# Patient Record
Sex: Female | Born: 1985 | Race: Black or African American | Hispanic: No | Marital: Married | Smoking: Current every day smoker
Health system: Southern US, Community
[De-identification: ages and names within clinical notes are randomized; demographics above are authoritative.]

## PROBLEM LIST (undated history)

## (undated) ENCOUNTER — Inpatient Hospital Stay (HOSPITAL_COMMUNITY): Payer: Self-pay

## (undated) DIAGNOSIS — B977 Papillomavirus as the cause of diseases classified elsewhere: Secondary | ICD-10-CM

## (undated) HISTORY — PX: WISDOM TOOTH EXTRACTION: SHX21

---
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
## 2007-03-28 ENCOUNTER — Emergency Department (HOSPITAL_COMMUNITY): Admission: EM | Admit: 2007-03-28 | Discharge: 2007-03-29 | Payer: Self-pay | Admitting: Emergency Medicine

## 2010-11-03 ENCOUNTER — Encounter: Payer: Self-pay | Admitting: Family Medicine

## 2011-02-28 NOTE — H&P (Signed)
[name], [name]                ACCOUNT NO.:  [id number]   MEDICAL RECORD NO.:  [id number]          PATIENT TYPE:  INP   LOCATION:  3036                         FACILITY:  [facility]   PHYSICIAN:  [name], M.D.DATE OF BIRTH:  Apr 13, 1986   DATE OF ADMISSION:  03/28/2006  DATE OF DISCHARGE:                                HISTORY & PHYSICAL   CHIEF COMPLAINT:  Abdominal pain.   HISTORY OF PRESENT ILLNESS:  This is a 25 year old G1, P0-1-0-1, who  presented to Urgent Care with abdominal pain, vomiting, chills, and sweats  for approximately 1 week. The patient states that the pain began as cramping  and progressed to a constant throbbing pain approximately 4 days ago,  located in the left lower quadrant and peri-umbilically. Also patient has  nausea and vomiting and the inability to keep food down. She tried multiple  medications per her parents including Ibuprofen 800 mg tablet, Nexium,  hydrocodone, and a prescription nausea medication that she was unaware of  the name. These medications may have provided some relief, however, the pain  always came back. She states that she had had sweats and chills throughout  the night, unable to sleep. She is sexually active with 1 female for the last  4 years. No condom usage. She was treated for gonorrhea approximately 6  months ago. However, the patient states that she only had a IM injection.  She did not get her prescription pills filled, so she never took the  complete treatment. The patient's partner has been treated since that time.   ALLERGIES:  NO KNOWN DRUG ALLERGIES.   CURRENT MEDICATIONS:  None except for what was stated above, right now.   PAST MEDICAL HISTORY:  1.  Significant for vaginal delivery in 2006.  2.  Migraines.   SOCIAL HISTORY:  The patient lives with her parents. She is in a current  relationship with the father of her baby for the past 4 years. She is  planning on attending [employer]. She  has graduated from  high school. Her last intercourse was approximately 3 weeks ago. Prior use,  history of marijuana usage in the past. None currently. No other drug usage.  No tobacco usage.   FAMILY HISTORY:  Significant for diabetes in a number of relatives, aunts,  and grandparents.   PHYSICAL EXAMINATION:  VITAL SIGNS:  Temperature 97.9, blood pressure  118/67, pulse 82, respiratory rate 18. O2 saturation 97% on room air.  GENERAL:  She is alert. No apparent distress.  HEENT:  Pupils are equal, round, and reactive to light, pinpoint. No  thyromegaly. No adenopathy. Oropharynx is clear.  CARDIOVASCULAR:  Regular rate and rhythm. No murmur, rub, or gallop.  LUNGS:  Clear to auscultation bilaterally with good respiratory effort and  air movement.  ABDOMEN:  Positive bowel sounds. Soft and tender to palpation in the left  lower quadrant. No rebound or guarding, however. No palpable masses.  PELVIC:  Thin yellow discharge noted. Difficulty doing examination and the  patient's pain level with examination. Ring forceps were used to remove her  IUD and GC Chlamydia probe was obtained and that was the extent of the exam.   LABORATORY DATA:  In Urgent Care, patient had a wet prep done, which was  negative. A urinalysis that showed 100 glucose, moderate bilirubin, 80  ketones, 100 protein, trace leukocyte esterase. A Meikle blood cell count of  17.3. Hemoglobin of 14.7. Platelet count of 271,000 with an absolute  neutrophil count of 13.9.  Labs pending here are C-met, CBC with  differential, GC and Chlamydia probe, HIV, RPR, PT, PTT.   Pelvic ultrasound was obtained at [facility], which  showed a 7 x 5 cm left adnexal TOA with a small amount of free fluid and IUD  in the uterus.   ASSESSMENT/PLAN:  1.  A 25 year old African-American female with tubal ovarian abscess/pelvic      inflammatory disease. Curb sided gynecology. Will begin Unasyn IV and      Doxycycline p.o.  concurrently. Will continue IV antibiotics at least 48      hours or if the patient is febrile, continue until she has been afebrile      for 48 hours. If she has not had any improvement, will consider      Interventional Radiology for isolation of the abscess, however, do not      think that it is needed currently and actually could be detrimental if      done while she has so much inflammation, which could lead to a      peritonitis.  2.  Highly sexual behavior. IUD removed. Will screen for STD. Discussed safe      sex practices and risks that she is taking.  3.  FEN. The patient is currently tolerating clear liquids but she did have      an IM injection of Phenergan at [facility]. Will begin      maintenance IV fluids and it appears that she does not appear severely      dehydrated now and advance her diet as tolerated. Will continue      Phenergan p.r.n.      Barbara Quin, MD    ______________________________  Barbara Bumpers Leveda Anna, [name]    AD/MEDQ  D:  03/28/2006  T:  03/28/2006  Job:  [id number]   cc:   Dr. [facility]

## 2011-02-28 NOTE — Discharge Summary (Signed)
NAMEPAYSON, CRUMBY NO.:  [id number]   MEDICAL RECORD NO.:  [id number]          PATIENT TYPE:  OUT   LOCATION:  XRAY                         FACILITY:  [facility]   PHYSICIAN:  Alanson Puls, [name]    DATE OF BIRTH:  1986-08-03   DATE OF ADMISSION:  03/28/2006  DATE OF DISCHARGE:  03/28/2006                                 DISCHARGE SUMMARY   ADDENDUM TO PREVIOUS DICTATION #[id number]   DISCHARGE MEDICATIONS:  Were changed to levofloxacin 500 mg p.o. daily for a  total of 14 days until April 10, 2006 and metronidazole 500 mg p.o. b.i.d.  for 14 days until April 10, 2006 per [facility] outpatient recommendations.      Alanson Puls, [name]     MR/MEDQ  D:  04/03/2006  T:  04/03/2006  Job:  [id number]

## 2011-02-28 NOTE — Discharge Summary (Signed)
[name], [name]                ACCOUNT NO.:  [id number]   MEDICAL RECORD NO.:  [id number]          PATIENT TYPE:  INP   LOCATION:  9307                          FACILITY:  [facility]   PHYSICIAN:  James A. Ashley Royalty, M.D.DATE OF BIRTH:  [birth year]   DATE OF ADMISSION:  05/06/2005  DATE OF DISCHARGE:  05/08/2005                                 DISCHARGE SUMMARY   ADMITTING DIAGNOSES:  1.  Intrauterine pregnancy at 31 weeks 5 days.  2.  Preterm labor.  3.  Unknown group B streptococcus status.   DISCHARGE DIAGNOSES:  1.  Intrauterine pregnancy at 31 weeks 5 days.  2.  Preterm labor.  3.  Unknown group B streptococcus status.   PROCEDURES:  1.  Normal spontaneous vaginal delivery of viable female infant.  2.  Division of nuchal cord.   HISTORY OF THE PRESENT ILLNESS:  The patient is a 25 year old primigravida  with an EDC of July 03, 2005. Prenatal course was complicated by  bacterial vaginosis. She also has a history of heavy periods and strong  family history of diabetes.   LABORATORY:  Blood type A positive, antibody screen negative. RPR, HbsAg,  HIV nonreactive.   HOSPITAL COURSE AND TREATMENT:  The patient was admitted on May 06, 2005  with onset of uterine contractions which had been occurring for several  hours. She was immediately sent to maternity admissions where she was noted  to be almost completely dilated. Artificial rupture of membranes revealed  clear fluid. She was prophylactically treated with antibiotics for group B  strep and proceeded to deliver a female infant weighing 3 pounds 8 ounces  over a midline episiotomy. Placenta was sent to pathology.   POSTOPERATIVE COURSE:  The patient remained afebrile, was able to be  discharged in satisfactory condition on May 08, 2005.   DISCHARGE INSTRUCTIONS:  Follow up in the office in 6 weeks. Motrin 600 mg  q.i.d. Hemoglobin 10.5.     Elwyn Lade . Hancock, N.P.    ______________________________  Rudy Jew  Ashley Royalty, [name]   MKH/MEDQ  D:  06/04/2005  T:  06/04/2005  Job:  [id number]

## 2011-02-28 NOTE — Discharge Summary (Signed)
NAMEBRAYLI, KLINGBEIL                ACCOUNT NO.:  [id number]   MEDICAL RECORD NO.:  [id number]          PATIENT TYPE:  INP   LOCATION:  3036                         FACILITY:  [facility]   PHYSICIAN:  Alanson Puls, [name]    DATE OF BIRTH:  December 26, 1985   DATE OF ADMISSION:  03/28/2006  DATE OF DISCHARGE:                                 DISCHARGE SUMMARY   DICTATED BY:  Glorianne Manchester, [name]   ATTENDING PHYSICIAN AT ADMISSION:  Dr. Doralee Albino.   ATTENDING PHYSICIAN AT DISCHARGE:  Dr. Tawanna Cooler McDiarmid.   ADMISSION DIAGNOSES:  1.  Left tubo-ovarian abscess measuring approximately 5.3 x 7.2 x 7.3 cm.  2.  History of gonorrhea treated 3 months prior to admission.   DISCHARGE DIAGNOSES:  1.  Left tubo-ovarian abscess measuring approximately 5.3 x 7.2 x 7.3 cm.  2.  History of gonorrhea treated 3 months prior to admission/high-risk      sexual behavior.  3.  On status post intrauterine device removal on admission on March 28, 2006.   HOSPITAL COURSE:  [name] is a 25 year old female who presented [facility] with persistent lower abdominal pain, vomiting, fevers and  chills for 1 week's duration and patient was found to have a left tubo-  ovarian abscess on transvaginal ultrasound.  Urine pregnancy test was  negative. Wet prep was obtained which was negative per the outside facility.  GC and Chlamydia cultures were obtained.  Prior to admission here, the  patient was found to be positive for gonorrhea and was treated with  ceftriaxone 1 g IV x1.  Chlamydia cultures were negative.  Given high sexual  behavior, she was screened for other STDs including HIV and syphilis which  were nonreactive here.  Her IUD was removed upon admission here and she  completed a total of 5 days Unasyn 3 g IV q.6 as well as doxycycline 100 mg  p.o. b.i.d.  Patient did undergo a CT of the abdomen on April 01, 2006 which  revealed a 7 cm complex lesion in her left pelvis that is representative  of  a tubo-ovarian abscess, although the lesion could be assessed  percutaneously, the fact that it has small loculations make the cyst not a  good candidate for percutaneous drainage.  Therefore, she did not have  drainage of this abscess.  Prior to discharge, patient was afebrile.  Blood  cultures were negative to date.  Dorsch blood cell count 9.7 with 63% segs.   DISCHARGE MEDICATIONS:  1.  Cefuroxime 500 mg p.o. b.i.d. for a total of 14 days to stop on April 10, 2006.  2.  Doxycycline 100 mg p.o. b.i.d. for a total of 14 days to stop on April 10, 2006.   DISCHARGE INSTRUCTIONS:  Patient was told that she could take ibuprofen as  well as Tylenol per bottle instructions over-the-counter for pain.  She was  counseled on contraceptive use and to choose a different alternative for  contraception since her IUD has been removed.  She is to follow up with Dr.  Reed Breech at [facility] and given the number [phone].  Call to  schedule this appointment within 1-2 weeks and to have a repeat ultrasound  on  May 14, 2006, 6-8 weeks after the diagnosis to followup on the resolution  of the tubo-ovarian abscess.  These results should be sent to [facility] to Dr. Reed Breech.  She is instructed to return to the emergency department  or clinic for fever, abdominal pain, inability to take food or drink by  mouth.      Alanson Puls, [name]     MR/MEDQ  D:  04/03/2006  T:  04/03/2006  Job:  [id number]   cc:   Onalee Hua L. Reed Breech, [name]  Fax: [phone]

## 2011-07-30 LAB — I-STAT 8, (EC8 V) (CONVERTED LAB)
Acid-Base Excess: 2
BUN: 9
Bicarbonate: 27.4 — ABNORMAL HIGH
Glucose, Bld: 90
HCT: 46
Hemoglobin: 15.6
Operator id: 277751
Potassium: 3.9
Sodium: 140
pH, Ven: 7.379 — ABNORMAL HIGH

## 2011-07-30 LAB — GC/CHLAMYDIA PROBE AMP, GENITAL: GC Probe Amp, Genital: POSITIVE — AB

## 2011-07-30 LAB — URINE MICROSCOPIC-ADD ON

## 2011-07-30 LAB — RPR: RPR Ser Ql: NONREACTIVE

## 2011-07-30 LAB — CBC
MCHC: 33.3
Platelets: 171
WBC: 9.6

## 2011-07-30 LAB — URINALYSIS, ROUTINE W REFLEX MICROSCOPIC
Nitrite: NEGATIVE
Specific Gravity, Urine: 1.03
Urobilinogen, UA: 2 — ABNORMAL HIGH

## 2011-07-30 LAB — POCT I-STAT CREATININE: Operator id: 277751

## 2011-07-30 LAB — DIFFERENTIAL
Basophils Relative: 0
Eosinophils Relative: 1
Monocytes Relative: 10

## 2013-03-02 ENCOUNTER — Emergency Department (HOSPITAL_COMMUNITY)
Admission: EM | Admit: 2013-03-02 | Discharge: 2013-03-02 | Disposition: A | Discharge status: Home / Self Care | Payer: Self-pay | Attending: Emergency Medicine | Admitting: Emergency Medicine

## 2013-03-02 ENCOUNTER — Encounter (HOSPITAL_COMMUNITY): Payer: Self-pay | Admitting: *Deleted

## 2013-03-02 DIAGNOSIS — K920 Hematemesis: Secondary | ICD-10-CM | POA: Insufficient documentation

## 2013-03-02 DIAGNOSIS — Z3202 Encounter for pregnancy test, result negative: Secondary | ICD-10-CM | POA: Insufficient documentation

## 2013-03-02 DIAGNOSIS — R630 Anorexia: Secondary | ICD-10-CM | POA: Insufficient documentation

## 2013-03-02 DIAGNOSIS — F17200 Nicotine dependence, unspecified, uncomplicated: Secondary | ICD-10-CM | POA: Insufficient documentation

## 2013-03-02 DIAGNOSIS — N390 Urinary tract infection, site not specified: Secondary | ICD-10-CM | POA: Insufficient documentation

## 2013-03-02 DIAGNOSIS — E860 Dehydration: Secondary | ICD-10-CM | POA: Insufficient documentation

## 2013-03-02 DIAGNOSIS — R1013 Epigastric pain: Secondary | ICD-10-CM | POA: Insufficient documentation

## 2013-03-02 DIAGNOSIS — R112 Nausea with vomiting, unspecified: Secondary | ICD-10-CM | POA: Insufficient documentation

## 2013-03-02 DIAGNOSIS — M6281 Muscle weakness (generalized): Secondary | ICD-10-CM | POA: Insufficient documentation

## 2013-03-02 DIAGNOSIS — R109 Unspecified abdominal pain: Secondary | ICD-10-CM

## 2013-03-02 LAB — URINALYSIS, ROUTINE W REFLEX MICROSCOPIC
Bilirubin Urine: NEGATIVE
Glucose, UA: NEGATIVE mg/dL
Ketones, ur: 15 mg/dL — AB
Urobilinogen, UA: 1 mg/dL (ref 0.0–1.0)

## 2013-03-02 LAB — CBC WITH DIFFERENTIAL/PLATELET
Basophils Relative: 0 % (ref 0–1)
Hemoglobin: 16.2 g/dL — ABNORMAL HIGH (ref 12.0–15.0)
Lymphocytes Relative: 21 % (ref 12–46)
Lymphs Abs: 1.5 10*3/uL (ref 0.7–4.0)
Neutro Abs: 5.4 10*3/uL (ref 1.7–7.7)
Neutrophils Relative %: 72 % (ref 43–77)
Platelets: 232 10*3/uL (ref 150–400)
RDW: 13.7 % (ref 11.5–15.5)

## 2013-03-02 LAB — COMPREHENSIVE METABOLIC PANEL
ALT: 13 U/L (ref 0–35)
Alkaline Phosphatase: 63 U/L (ref 39–117)
CO2: 23 mEq/L (ref 19–32)
Calcium: 9.6 mg/dL (ref 8.4–10.5)
Chloride: 102 mEq/L (ref 96–112)
GFR calc Af Amer: >90 mL/min (ref >90)
Glucose, Bld: 115 mg/dL — ABNORMAL HIGH (ref 70–99)
Potassium: 4.2 mEq/L (ref 3.5–5.1)
Total Bilirubin: 0.2 mg/dL — ABNORMAL LOW (ref 0.3–1.2)
Total Protein: 8.1 g/dL (ref 6.0–8.3)

## 2013-03-02 LAB — URINE MICROSCOPIC-ADD ON

## 2013-03-02 MED ORDER — ONDANSETRON HCL 4 MG/2ML IJ SOLN
4.0000 mg | Freq: Once | INTRAMUSCULAR | Status: AC
  Administered 2013-03-02: 4 mg via INTRAVENOUS
  Filled 2013-03-02: qty 2

## 2013-03-02 MED ORDER — HYDROCODONE-ACETAMINOPHEN 5-325 MG PO TABS: 1.0000 | ORAL_TABLET | Freq: Four times a day (QID) | ORAL | Status: DC | PRN

## 2013-03-02 MED ORDER — GI COCKTAIL ~~LOC~~
30.0000 mL | Freq: Once | ORAL | Status: AC
  Administered 2013-03-02: 30 mL via ORAL
  Filled 2013-03-02: qty 30

## 2013-03-02 MED ORDER — CIPROFLOXACIN HCL 500 MG PO TABS: 500.0000 mg | ORAL_TABLET | Freq: Two times a day (BID) | ORAL | Status: DC

## 2013-03-02 MED ORDER — DEXTROSE 5 % IV SOLN
1.0000 g | Freq: Once | INTRAVENOUS | Status: AC
  Administered 2013-03-02: 1 g via INTRAVENOUS
  Filled 2013-03-02: qty 10

## 2013-03-02 MED ORDER — ONDANSETRON HCL 4 MG PO TABS: 4.0000 mg | ORAL_TABLET | Freq: Four times a day (QID) | ORAL | Status: DC

## 2013-03-02 MED ORDER — SODIUM CHLORIDE 0.9 % IV SOLN
Freq: Once | INTRAVENOUS | Status: AC
  Administered 2013-03-02: 21:00:00 via INTRAVENOUS

## 2013-03-02 MED ORDER — ESOMEPRAZOLE MAGNESIUM 40 MG PO CPDR: 40.0000 mg | DELAYED_RELEASE_CAPSULE | Freq: Every day | ORAL | Status: DC

## 2013-03-02 MED ORDER — MORPHINE SULFATE 4 MG/ML IJ SOLN
8.0000 mg | Freq: Once | INTRAMUSCULAR | Status: AC
  Administered 2013-03-02: 8 mg via INTRAVENOUS
  Filled 2013-03-02: qty 2

## 2013-03-02 NOTE — ED Notes (Signed)
Pt is here with mid upper abdominal and epigastric pain that started this am.  Pt reports that she has been vomiting all morning.  No diarrhea or constipation

## 2013-03-02 NOTE — ED Provider Notes (Signed)
History     CSN: [id number]  Arrival date & time 03/02/13  1338   First MD Initiated Contact with Patient 03/02/13 1910      Chief Complaint  Patient presents with  . Abdominal Pain    (Consider location/radiation/quality/duration/timing/severity/associated sxs/prior treatment) HPI  [name] is a 27 y.o. female with complaint of gastrointestinal symptoms of abdominal pain, nausea, vomiting, anorexia, blood in emesis, dehydration, weakness for 1 days. No blood in stool. Patient awoke from sleep at approximately 3 AM this morning with epigastric pain nausea and vomiting.  She states she had innumerable episodes of vomiting.  Later in the day she noticed some blood streaking in your vomit and some small clots with vomiting.  She denies any coffee-ground emesis.  The patient does complain of epigastric pain and feeling like there is some pressure in her belly and chest.  She denies any use of excessiveness estrogens, and he unilateral leg swelling or calf pain.  She denies a history of DVT or pulmonary embolism. She had one episode of loose stool but denies any diarrhea.  Patient denies any urinary symptoms.  She denies any vaginal symptoms.  She is a current daily smoker. She denies contacts with similar sxs, ingestion of suspect foods or water, history of similar sxs, recent foreign travel .    History reviewed. No pertinent past medical history.  History reviewed. No pertinent past surgical history.  No family history on file.  History  Substance Use Topics  . Smoking status: Current Every Day Smoker  . Smokeless tobacco: Not on file  . Alcohol Use: Yes     Comment: occ    OB History   Grav Para Term Preterm Abortions TAB SAB Ect Mult Living                  Review of Systems Ten systems reviewed and are negative for acute change, except as noted in the HPI.   Allergies  Review of patient's allergies indicates no known allergies.  Home Medications  No current  outpatient prescriptions on file.  BP 123/75  Pulse 65  Temp(Src) 98.8 F (37.1 C) (Oral)  Resp 16  Wt 197 lb 7 oz (89.557 kg)  SpO2 100%  LMP 02/23/2013  Physical Exam Physical Exam  Nursing note and vitals reviewed. Constitutional: She is oriented to person, place, and time. She appears well-developed and well-nourished. No distress. Appears uncomfortable HENT:  Head: Normocephalic and atraumatic.  Eyes: Conjunctivae normal and EOM are normal. Pupils are equal, round, and reactive to light. No scleral icterus.  Neck: Normal range of motion.  Cardiovascular: Normal rate, regular rhythm and normal heart sounds.  Exam reveals no gallop and no friction rub.   No murmur heard. Pulmonary/Chest: Effort normal and breath sounds normal. No respiratory distress.  Abdominal: Soft. Bowel sounds are normal. She exhibits no distension and no mass. Tenderness in the epigastrium. Mild tenderness to palpation ing the lower abdomen. Neurological: She is alert and oriented to person, place, and time.  Skin: Skin is warm and dry. She is not diaphoretic.    ED Course  Procedures (including critical care time)  Labs Reviewed  CBC WITH DIFFERENTIAL - Abnormal; Notable for the following:    RBC 5.18 (*)    Hemoglobin 16.2 (*)    MCHC 36.4 (*)    All other components within normal limits  COMPREHENSIVE METABOLIC PANEL - Abnormal; Notable for the following:    Glucose, Bld 115 (*)  Total Bilirubin 0.2 (*)    All other components within normal limits  URINALYSIS, ROUTINE W REFLEX MICROSCOPIC - Abnormal; Notable for the following:    APPearance CLOUDY (*)    pH 8.5 (*)    Ketones, ur 15 (*)    Protein, ur 30 (*)    Nitrite POSITIVE (*)    Leukocytes, UA SMALL (*)    All other components within normal limits  URINE MICROSCOPIC-ADD ON - Abnormal; Notable for the following:    Squamous Epithelial / LPF FEW (*)    Bacteria, UA FEW (*)    All other components within normal limits  URINE  CULTURE  LIPASE, BLOOD  POCT PREGNANCY, URINE   No results found.   1. UTI (lower urinary tract infection)   2. Abdominal pain   3. Nausea and vomiting       MDM  9:59 PM Patient appeasr to have uti , dehydration. Treating with IV rocephin. Pain meds/ antinausea. I suspect mallory weiss tear and i feel that is the reason for her pressure. Wells low risk and PERC negative.   11:18 PM Patient pain resolved tolerating PO fluids. Patient is nontoxic, nonseptic appearing, in no apparent distress.  Patient's pain and other symptoms adequately managed in emergency department.  Fluid bolus given.  Labs, imaging and vitals reviewed.  Patient does not meet the SIRS or Sepsis criteria.  On repeat exam patient does not have a surgical abdomin and there are nor peritoneal signs.  No indication of appendicitis, bowel obstruction, bowel perforation, cholecystitis, diverticulitis.  Patient discharged home with symptomatic treatment and given strict instructions for follow-up with their primary care physician.  I have also discussed reasons to return immediately to the ER.  Patient expresses understanding and agrees with plan.       Arthor Captain, PA-C 03/02/13 2319

## 2013-03-02 NOTE — ED Notes (Signed)
Pt alert and mentating appropriately upon d/c teaching and prescriptions given. Pt verbalizes understanding and has no further questions upon d/c. NAD noted upon d/c. Pt instructed not to drive. Pt endorses she will not be driving and pts sister at bedside states she will be driving home.

## 2013-03-02 NOTE — ED Notes (Signed)
Pt states she was awoke from sleep this morning around 3 am with pain and then vomited. Pt states she has continued to vomit since 3 am. Pt states one episode of diarrhea with constant nausea and constant abdominal pain. Pt denies urinary symptoms. Pt states dizziness with standing. Pt alert and mentating appropriately.

## 2013-03-03 LAB — URINE CULTURE
Colony Count: NO GROWTH
Culture: NO GROWTH

## 2013-03-03 NOTE — ED Provider Notes (Signed)
Medical screening examination/treatment/procedure(s) were performed by non-physician practitioner and as supervising physician I was immediately available for consultation/collaboration.    Shonn Farruggia D Hammond Obeirne, MD 03/03/13 1343 

## 2016-03-13 ENCOUNTER — Ambulatory Visit (INDEPENDENT_AMBULATORY_CARE_PROVIDER_SITE_OTHER): Payer: BLUE CROSS/BLUE SHIELD | Admitting: Family Medicine

## 2016-03-13 VITALS — BP 126/80 | HR 75 | Temp 98.1°F | Resp 16 | Ht 66.0 in | Wt 199.0 lb

## 2016-03-13 DIAGNOSIS — Z113 Encounter for screening for infections with a predominantly sexual mode of transmission: Secondary | ICD-10-CM

## 2016-03-13 DIAGNOSIS — N898 Other specified noninflammatory disorders of vagina: Secondary | ICD-10-CM

## 2016-03-13 DIAGNOSIS — E300 Delayed puberty: Secondary | ICD-10-CM

## 2016-03-13 DIAGNOSIS — Z3201 Encounter for pregnancy test, result positive: Secondary | ICD-10-CM

## 2016-03-13 DIAGNOSIS — N939 Abnormal uterine and vaginal bleeding, unspecified: Secondary | ICD-10-CM

## 2016-03-13 DIAGNOSIS — O2311 Infections of bladder in pregnancy, first trimester: Secondary | ICD-10-CM

## 2016-03-13 LAB — CBC
HCT: 41.9 % (ref 35.0–45.0)
Hemoglobin: 14 g/dL (ref 11.7–15.5)
MCH: 29.7 pg (ref 27.0–33.0)
MCHC: 33.4 g/dL (ref 32.0–36.0)
MCV: 89 fL (ref 80.0–100.0)
MPV: 8.7 fL (ref 7.5–12.5)
PLATELETS: 228 10*3/uL (ref 140–400)
RBC: 4.71 MIL/uL (ref 3.80–5.10)
RDW: 14.1 % (ref 11.0–15.0)
WBC: 4.7 10*3/uL (ref 3.8–10.8)

## 2016-03-13 LAB — POCT URINALYSIS DIP (MANUAL ENTRY)
Bilirubin, UA: NEGATIVE
Glucose, UA: NEGATIVE
Leukocytes, UA: NEGATIVE
Nitrite, UA: POSITIVE — AB
PH UA: 7
SPEC GRAV UA: 1.02
UROBILINOGEN UA: 1

## 2016-03-13 LAB — HCG, QUANTITATIVE, PREGNANCY: hCG, Beta Chain, Quant, S: 595.4 m[IU]/mL — ABNORMAL HIGH

## 2016-03-13 LAB — POC MICROSCOPIC URINALYSIS (UMFC): MUCUS RE: ABSENT

## 2016-03-13 LAB — POCT URINE PREGNANCY: PREG TEST UR: POSITIVE — AB

## 2016-03-13 MED ORDER — NITROFURANTOIN MONOHYD MACRO 100 MG PO CAPS: 100.0000 mg | ORAL_CAPSULE | Freq: Two times a day (BID) | ORAL | Status: DC
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
## 2016-03-13 NOTE — Patient Instructions (Addendum)
Please start an over the counter pre natal vitamin- take daily  Please avoid all smoking and alcohol use  Your estimated due date based on your last period is November 20, 2016  We will contact you regarding your lab tests  Local obstetricians  [facility] ob/gyn- [phone] [facility] ob/gyn- [phone] [facility] ob/gyn- [phone] [place] [facility]- [phone] [place]- [phone]  First Trimester of Pregnancy The first trimester of pregnancy is from week 1 until the end of week 12 (months 1 through 3). A week after a sperm fertilizes an egg, the egg will implant on the wall of the uterus. This embryo will begin to develop into a baby. Genes from you and your partner are forming the baby. The female genes determine whether the baby is a boy or a girl. At 6-8 weeks, the eyes and face are formed, and the heartbeat can be seen on ultrasound. At the end of 12 weeks, all the baby's organs are formed.  Now that you are pregnant, you will want to do everything you can to have a healthy baby. Two of the most important things are to get good prenatal care and to follow your health care provider's instructions. Prenatal care is all the medical care you receive before the baby's birth. This care will help prevent, find, and treat any problems during the pregnancy and childbirth. BODY CHANGES Your body goes through many changes during pregnancy. The changes vary from woman to woman.   You may gain or lose a couple of pounds at first.  You may feel sick to your stomach (nauseous) and throw up (vomit). If the vomiting is uncontrollable, call your health care provider.  You may tire easily.  You may develop headaches that can be relieved by medicines approved by your health care provider.  You may urinate more often. Painful urination may mean you have a bladder infection.  You may develop heartburn as a result of your pregnancy.  You may develop constipation because certain  hormones are causing the muscles that push waste through your intestines to slow down.  You may develop hemorrhoids or swollen, bulging veins (varicose veins).  Your breasts may begin to grow larger and become tender. Your nipples may stick out more, and the tissue that surrounds them (areola) may become darker.  Your gums may bleed and may be sensitive to brushing and flossing.  Dark spots or blotches (chloasma, mask of pregnancy) may develop on your face. This will likely fade after the baby is born.  Your menstrual periods will stop.  You may have a loss of appetite.  You may develop cravings for certain kinds of food.  You may have changes in your emotions from day to day, such as being excited to be pregnant or being concerned that something may go wrong with the pregnancy and baby.  You may have more vivid and strange dreams.  You may have changes in your hair. These can include thickening of your hair, rapid growth, and changes in texture. Some women also have hair loss during or after pregnancy, or hair that feels dry or thin. Your hair will most likely return to normal after your baby is born. WHAT TO EXPECT AT YOUR PRENATAL VISITS During a routine prenatal visit:  You will be weighed to make sure you and the baby are growing normally.  Your blood pressure will be taken.  Your abdomen will be measured to track your baby's growth.  The fetal heartbeat will be listened  to starting around week 10 or 12 of your pregnancy.  Test results from any previous visits will be discussed. Your health care provider may ask you:  How you are feeling.  If you are feeling the baby move.  If you have had any abnormal symptoms, such as leaking fluid, bleeding, severe headaches, or abdominal cramping.  If you are using any tobacco products, including cigarettes, chewing tobacco, and electronic cigarettes.  If you have any questions. Other tests that may be performed during your first  trimester include:  Blood tests to find your blood type and to check for the presence of any previous infections. They will also be used to check for low iron levels (anemia) and Rh antibodies. Later in the pregnancy, blood tests for diabetes will be done along with other tests if problems develop.  Urine tests to check for infections, diabetes, or protein in the urine.  An ultrasound to confirm the proper growth and development of the baby.  An amniocentesis to check for possible genetic problems.  Fetal screens for spina bifida and Down syndrome.  You may need other tests to make sure you and the baby are doing well.  HIV (human immunodeficiency virus) testing. Routine prenatal testing includes screening for HIV, unless you choose not to have this test. HOME CARE INSTRUCTIONS  Medicines  Follow your health care provider's instructions regarding medicine use. Specific medicines may be either safe or unsafe to take during pregnancy.  Take your prenatal vitamins as directed.  If you develop constipation, try taking a stool softener if your health care provider approves. Diet  Eat regular, well-balanced meals. Choose a variety of foods, such as meat or vegetable-based protein, fish, milk and low-fat dairy products, vegetables, fruits, and whole grain breads and cereals. Your health care provider will help you determine the amount of weight gain that is right for you.  Avoid raw meat and uncooked cheese. These carry germs that can cause birth defects in the baby.  Eating four or five small meals rather than three large meals a day may help relieve nausea and vomiting. If you start to feel nauseous, eating a few soda crackers can be helpful. Drinking liquids between meals instead of during meals also seems to help nausea and vomiting.  If you develop constipation, eat more high-fiber foods, such as fresh vegetables or fruit and whole grains. Drink enough fluids to keep your urine clear or  pale yellow. Activity and Exercise  Exercise only as directed by your health care provider. Exercising will help you:  Control your weight.  Stay in shape.  Be prepared for labor and delivery.  Experiencing pain or cramping in the lower abdomen or low back is a good sign that you should stop exercising. Check with your health care provider before continuing normal exercises.  Try to avoid standing for long periods of time. Move your legs often if you must stand in one place for a long time.  Avoid heavy lifting.  Wear low-heeled shoes, and practice good posture.  You may continue to have sex unless your health care provider directs you otherwise. Relief of Pain or Discomfort  Wear a good support bra for breast tenderness.   Take warm sitz baths to soothe any pain or discomfort caused by hemorrhoids. Use hemorrhoid cream if your health care provider approves.   Rest with your legs elevated if you have leg cramps or low back pain.  If you develop varicose veins in your legs, wear support hose.  Elevate your feet for 15 minutes, 3-4 times a day. Limit salt in your diet. Prenatal Care  Schedule your prenatal visits by the twelfth week of pregnancy. They are usually scheduled monthly at first, then more often in the last 2 months before delivery.  Write down your questions. Take them to your prenatal visits.  Keep all your prenatal visits as directed by your health care provider. Safety  Wear your seat belt at all times when driving.  Make a list of emergency phone numbers, including numbers for family, friends, the hospital, and police and fire departments. General Tips  Ask your health care provider for a referral to a local prenatal education class. Begin classes no later than at the beginning of month 6 of your pregnancy.  Ask for help if you have counseling or nutritional needs during pregnancy. Your health care provider can offer advice or refer you to specialists for  help with various needs.  Do not use hot tubs, steam rooms, or saunas.  Do not douche or use tampons or scented sanitary pads.  Do not cross your legs for long periods of time.  Avoid cat litter boxes and soil used by cats. These carry germs that can cause birth defects in the baby and possibly loss of the fetus by miscarriage or stillbirth.  Avoid all smoking, herbs, alcohol, and medicines not prescribed by your health care provider. Chemicals in these affect the formation and growth of the baby.  Do not use any tobacco products, including cigarettes, chewing tobacco, and electronic cigarettes. If you need help quitting, ask your health care provider. You may receive counseling support and other resources to help you quit.  Schedule a dentist appointment. At home, brush your teeth with a soft toothbrush and be gentle when you floss. SEEK MEDICAL CARE IF:   You have dizziness.  You have mild pelvic cramps, pelvic pressure, or nagging pain in the abdominal area.  You have persistent nausea, vomiting, or diarrhea.  You have a bad smelling vaginal discharge.  You have pain with urination.  You notice increased swelling in your face, hands, legs, or ankles. SEEK IMMEDIATE MEDICAL CARE IF:   You have a fever.  You are leaking fluid from your vagina.  You have spotting or bleeding from your vagina.  You have severe abdominal cramping or pain.  You have rapid weight gain or loss.  You vomit blood or material that looks like coffee grounds.  You are exposed to [place] measles and have never had them.  You are exposed to fifth disease or chickenpox.  You develop a severe headache.  You have shortness of breath.  You have any kind of trauma, such as from a fall or a car accident.   This information is not intended to replace advice given to you by your health care provider. Make sure you discuss any questions you have with your health care provider.   Document Released:  09/23/2001 Document Revised: 10/20/2014 Document Reviewed: 08/09/2013 Elsevier Interactive Patient Education 2016 [employer].    IF you received an x-ray today, you will receive an invoice from [facility]. Please contact [facility] at [phone] with questions or concerns regarding your invoice.   IF you received labwork today, you will receive an invoice from [employer]. Please contact [facility] at [phone] with questions or concerns regarding your invoice.   Our billing staff will not be able to assist you with questions regarding bills from these companies.  You will  be contacted with the lab results as soon as they are available. The fastest way to get your results is to activate your My Chart account. Instructions are located on the last page of this paperwork. If you have not heard from [place] regarding the results in 2 weeks, please contact this office.

## 2016-03-14 LAB — HIV ANTIBODY (ROUTINE TESTING W REFLEX): HIV: NONREACTIVE

## 2016-03-14 LAB — GC/CHLAMYDIA PROBE AMP
CT Probe RNA: NOT DETECTED
GC PROBE AMP APTIMA: NOT DETECTED

## 2016-03-14 LAB — RPR

## 2016-03-15 ENCOUNTER — Encounter (HOSPITAL_COMMUNITY): Payer: Self-pay

## 2016-03-15 ENCOUNTER — Emergency Department (HOSPITAL_COMMUNITY): Payer: BLUE CROSS/BLUE SHIELD

## 2016-03-15 ENCOUNTER — Emergency Department (HOSPITAL_COMMUNITY)
Admission: EM | Admit: 2016-03-15 | Discharge: 2016-03-15 | Disposition: A | Discharge status: Home / Self Care | Payer: BLUE CROSS/BLUE SHIELD | Attending: Emergency Medicine | Admitting: Emergency Medicine

## 2016-03-15 DIAGNOSIS — Z3A13 13 weeks gestation of pregnancy: Secondary | ICD-10-CM | POA: Diagnosis not present

## 2016-03-15 DIAGNOSIS — F17210 Nicotine dependence, cigarettes, uncomplicated: Secondary | ICD-10-CM | POA: Insufficient documentation

## 2016-03-15 DIAGNOSIS — O209 Hemorrhage in early pregnancy, unspecified: Secondary | ICD-10-CM | POA: Diagnosis not present

## 2016-03-15 DIAGNOSIS — Z79899 Other long term (current) drug therapy: Secondary | ICD-10-CM | POA: Insufficient documentation

## 2016-03-15 LAB — ABO/RH: ABO/RH(D): A POS

## 2016-03-15 LAB — CBC WITH DIFFERENTIAL/PLATELET
BASOS ABS: 0 10*3/uL (ref 0.0–0.1)
BASOS PCT: 0 %
Eosinophils Absolute: 0.1 10*3/uL (ref 0.0–0.7)
Eosinophils Relative: 2 %
HEMATOCRIT: 39.6 % (ref 36.0–46.0)
HEMOGLOBIN: 13.3 g/dL (ref 12.0–15.0)
LYMPHS PCT: 47 %
Lymphs Abs: 2.3 10*3/uL (ref 0.7–4.0)
MCH: 29 pg (ref 26.0–34.0)
MCHC: 33.6 g/dL (ref 30.0–36.0)
MCV: 86.3 fL (ref 78.0–100.0)
Monocytes Absolute: 0.6 10*3/uL (ref 0.1–1.0)
Monocytes Relative: 12 %
NEUTROS ABS: 1.9 10*3/uL (ref 1.7–7.7)
NEUTROS PCT: 39 %
Platelets: 226 10*3/uL (ref 150–400)
RBC: 4.59 MIL/uL (ref 3.87–5.11)
RDW: 14.2 % (ref 11.5–15.5)
WBC: 5 10*3/uL (ref 4.0–10.5)

## 2016-03-15 LAB — BASIC METABOLIC PANEL
ANION GAP: 8 (ref 5–15)
BUN: 8 mg/dL (ref 6–20)
CALCIUM: 9.3 mg/dL (ref 8.9–10.3)
CHLORIDE: 105 mmol/L (ref 101–111)
CO2: 24 mmol/L (ref 22–32)
Creatinine, Ser: 0.76 mg/dL (ref 0.44–1.00)
GFR calc non Af Amer: >60 mL/min (ref >60)
Glucose, Bld: 96 mg/dL (ref 65–99)
POTASSIUM: 4.1 mmol/L (ref 3.5–5.1)
Sodium: 137 mmol/L (ref 135–145)

## 2016-03-15 LAB — TYPE AND SCREEN
ABO/RH(D): A POS
Antibody Screen: NEGATIVE

## 2016-03-15 LAB — HCG, QUANTITATIVE, PREGNANCY: HCG, BETA CHAIN, QUANT, S: 834 m[IU]/mL — AB (ref <5)

## 2016-03-15 LAB — URINE CULTURE: Colony Count: >=100000

## 2016-03-15 LAB — WET PREP, GENITAL
SPERM: NONE SEEN
Trich, Wet Prep: NONE SEEN
Yeast Wet Prep HPF POC: NONE SEEN

## 2016-03-15 NOTE — ED Notes (Addendum)
Patient here with complaint of bright red vaginal bleeding since am. Just found out at urgent care 3 days ago she was pregnant. Also currently being treated for UTI. Describes as light bleeding, no clots

## 2016-03-15 NOTE — ED Provider Notes (Signed)
CSN: [id number]     Arrival date & time 03/15/16  4098 History   First MD Initiated Contact with Patient 03/15/16 0848     Chief Complaint  Patient presents with  . Vaginal Bleeding  . pregnant      (Consider location/radiation/quality/duration/timing/severity/associated sxs/prior Treatment) HPI [name] is a 30 y.o. female G1 P1 here for evaluation of vaginal bleeding and pregnancy. Patient reports her last menses was May 2, however she does report having very irregular periods. She reports being seen at an urgent care 3 days ago, diagnosed with UTI and pregnancy. She presents today after noticing bright red vaginal bleeding while wiping and several drops in the toilet. She also reports associated abdominal pressure that she relates to her UTI. She denies any fevers, chills, abdominal pain, nausea or vomiting. Nothing makes the problem better or worse. No other modifying factors.  History reviewed. No pertinent past medical history. History reviewed. No pertinent past surgical history. No family history on file. Social History  Substance Use Topics  . Smoking status: Current Every Day Smoker  . Smokeless tobacco: Never Used  . Alcohol Use: 0.0 oz/week    0 Standard drinks or equivalent per week     Comment: occ   OB History    No data available     Review of Systems A 10 point review of systems was completed and was negative except for pertinent positives and negatives as mentioned in the history of present illness     Allergies  Review of patient's allergies indicates no known allergies.  Home Medications   Prior to Admission medications   Medication Sig Start Date End Date Taking? Authorizing Provider  nitrofurantoin, macrocrystal-monohydrate, (MACROBID) 100 MG capsule Take 1 capsule (100 mg total) by mouth 2 (two) times daily. 03/13/16  Yes Emi Belfast, FNP   BP 130/71 mmHg  Pulse 110  Temp(Src) 99.2 F (37.3 C) (Oral)  Resp 18  Ht  (1.676 m)  Wt  90.266 kg  BMI 32.13 kg/m2  SpO2 100%  LMP 02/14/2016 (Exact Date) Physical Exam  Constitutional: She is oriented to person, place, and time. She appears well-developed and well-nourished.  HENT:  Head: Normocephalic and atraumatic.  Mouth/Throat: Oropharynx is clear and moist.  Eyes: Conjunctivae are normal. Pupils are equal, round, and reactive to light. Right eye exhibits no discharge. Left eye exhibits no discharge. No scleral icterus.  Neck: Neck supple.  Cardiovascular: Normal rate, regular rhythm and normal heart sounds.   Pulmonary/Chest: Effort normal and breath sounds normal. No respiratory distress. She has no wheezes. She has no rales.  Abdominal: Soft. There is no tenderness.  Genitourinary:  Chaperone was present for the entire genital exam. No lesions or rashes appreciated on vulva. Cervix visualized on speculum exam and appropriate cultures sampled. There is a lesion at approximately 1:00 position. Mild blood in vaginal vault. No obvious products of conception Discharge: None Upon bi manual exam- No TTP of the adnexa, no cervical motion tenderness. No fullness or masses appreciated. No abnormalities appreciated in structural anatomy.   Musculoskeletal: She exhibits no tenderness.  Neurological: She is alert and oriented to person, place, and time.  Cranial Nerves II-XII grossly intact  Skin: Skin is warm and dry. No rash noted.  Psychiatric: She has a normal mood and affect.  Nursing note and vitals reviewed.   ED Course  Procedures (including critical care time) Labs Review Labs Reviewed  HCG, QUANTITATIVE, PREGNANCY - Abnormal; Notable for the following:  hCG, Beta Chain, Quant, S 834 (*)    All other components within normal limits  WET PREP, GENITAL  CBC WITH DIFFERENTIAL/PLATELET  BASIC METABOLIC PANEL  RPR  HIV ANTIBODY (ROUTINE TESTING)  URINALYSIS, ROUTINE W REFLEX MICROSCOPIC (NOT AT [facility])  TYPE AND SCREEN  ABO/RH  GC/CHLAMYDIA PROBE AMP ([facility]) NOT AT [facility]    Imaging Review [place] Ob Comp Less 14 Wks  03/15/2016  CLINICAL DATA:  Vaginal bleeding/ spotting.  Early pregnancy. EXAM: OBSTETRIC <14 WK US AND TRANSVAGINAL OB US TECHNIQUE: Both transabdominal and transvaginal ultrasound examinations were performed for complete evaluation of the gestation as well as the maternal uterus, adnexal regions, and pelvic cul-de-sac. Transvaginal technique was performed to assess early pregnancy. COMPARISON:  CT abdomen and pelvis 03/29/2007. Pelvic ultrasound 03/28/2006. FINDINGS: Intrauterine gestational sac: None visualized. Maternal uterus/adnexae: In intramural fibroid in the right body of the uterus measures 4.6 x 4.0 x 3.5 cm. Endometrium measures 12 mm in thickness. The ovaries are unremarkable in appearance. Trace free fluid in the pelvis. IMPRESSION: 1. No intrauterine gestation or ectopic pregnancy identified. Clinical and laboratory follow-up recommended with consideration for short-term follow-up ultrasound as clinically warranted. 2. Trace pelvic free fluid. 3. 4.6 cm uterine fibroid. Electronically Signed   By: Sebastian AcheAllen  Grady [name]   On: 03/15/2016 11:45   [place] Ob Transvaginal  03/15/2016  CLINICAL DATA:  Vaginal bleeding/ spotting.  Early pregnancy. EXAM: OBSTETRIC <14 WK US AND TRANSVAGINAL OB US TECHNIQUE: Both transabdominal and transvaginal ultrasound examinations were performed for complete evaluation of the gestation as well as the maternal uterus, adnexal regions, and pelvic cul-de-sac. Transvaginal technique was performed to assess early pregnancy. COMPARISON:  CT abdomen and pelvis 03/29/2007. Pelvic ultrasound 03/28/2006. FINDINGS: Intrauterine gestational sac: None visualized. Maternal uterus/adnexae: In intramural fibroid in the right body of the uterus measures 4.6 x 4.0 x 3.5 cm. Endometrium measures 12 mm in thickness. The ovaries are unremarkable in appearance. Trace free fluid in the pelvis. IMPRESSION: 1. No intrauterine gestation or  ectopic pregnancy identified. Clinical and laboratory follow-up recommended with consideration for short-term follow-up ultrasound as clinically warranted. 2. Trace pelvic free fluid. 3. 4.6 cm uterine fibroid. Electronically Signed   By: Sebastian AcheAllen  Grady [name]   On: 03/15/2016 11:45   I have personally reviewed and evaluated these images and lab results as part of my medical decision-making.   EKG Interpretation None      MDM  Patient presents for positive pregnancy test and vaginal bleeding in first trimester. She is hemodynamically stable and afebrile. Initial tachycardia likely erroneous, patient is somewhat upset, no evidence of infection or other emergent cause of tachycardia. She has an unremarkable physical exam. Mild bleeding on pelvic exam. Os closed and no obvious products of conception. Beta hCG 2 days ago roughly 500, today is 834. Obtained a transvaginal ultrasound to rule out ectopic. No obvious ectopic or other abnormalities is noted. Patient's blood type A+. No RhoGAM indicated. Screening labs are unremarkable. Urinalysis deferred, patient is already being treated for UTI. Discussed results in ED course the patient, recommended follow-up with OB/GYN for repeat beta hCG as well as follow-up on cervical lesion. She verbalizes understanding and agrees with this plan and subsequent discharge. Prior to patient discharge, I discussed and reviewed this case with Dr. Criss AlvineGoldston  Patient stable, appropriate for discharge and outpatient follow-up. Final diagnoses:  Vaginal bleeding in pregnancy, first trimester        Joycie PeekBenjamin Ahley Bulls, PA-C 03/15/16 1319  Pricilla LovelessScott Goldston, MD 03/15/16  1556 

## 2016-03-15 NOTE — ED Notes (Signed)
Patient transported to Ultrasound 

## 2016-03-15 NOTE — Discharge Instructions (Signed)
Is important feet follow-up with the women's center for further evaluation and management of your pregnancy and vaginal bleeding. You will need to have your beta hCG rechecked to ensure it is rising appropriately. You will also need to have further follow-up for the lesion on your cervix. Your labs were all reassuring and there does not appear to be an emergent cause for your symptoms at this time. Return to ED for any new or worsening symptoms as we discussed.  Vaginal Bleeding During Pregnancy, First Trimester A small amount of bleeding (spotting) from the vagina is relatively common in early pregnancy. It usually stops on its own. Various things may cause bleeding or spotting in early pregnancy. Some bleeding may be related to the pregnancy, and some may not. In most cases, the bleeding is normal and is not a problem. However, bleeding can also be a sign of something serious. Be sure to tell your health care provider about any vaginal bleeding right away. Some possible causes of vaginal bleeding during the first trimester include:  Infection or inflammation of the cervix.  Growths (polyps) on the cervix.  Miscarriage or threatened miscarriage.  Pregnancy tissue has developed outside of the uterus and in a fallopian tube (tubal pregnancy).  Tiny cysts have developed in the uterus instead of pregnancy tissue (molar pregnancy). HOME CARE INSTRUCTIONS  Watch your condition for any changes. The following actions may help to lessen any discomfort you are feeling:  Follow your health care provider's instructions for limiting your activity. If your health care provider orders bed rest, you may need to stay in bed and only get up to use the bathroom. However, your health care provider may allow you to continue light activity.  If needed, make plans for someone to help with your regular activities and responsibilities while you are on bed rest.  Keep track of the number of pads you use each day, how  often you change pads, and how soaked (saturated) they are. Write this down.  Do not use tampons. Do not douche.  Do not have sexual intercourse or orgasms until approved by your health care provider.  If you pass any tissue from your vagina, save the tissue so you can show it to your health care provider.  Only take over-the-counter or prescription medicines as directed by your health care provider.  Do not take aspirin because it can make you bleed.  Keep all follow-up appointments as directed by your health care provider. SEEK MEDICAL CARE IF:  You have any vaginal bleeding during any part of your pregnancy.  You have cramps or labor pains.  You have a fever, not controlled by medicine. SEEK IMMEDIATE MEDICAL CARE IF:   You have severe cramps in your back or belly (abdomen).  You pass large clots or tissue from your vagina.  Your bleeding increases.  You feel light-headed or weak, or you have fainting episodes.  You have chills.  You are leaking fluid or have a gush of fluid from your vagina.  You pass out while having a bowel movement. MAKE SURE YOU:  Understand these instructions.  Will watch your condition.  Will get help right away if you are not doing well or get worse.   This information is not intended to replace advice given to you by your health care provider. Make sure you discuss any questions you have with your health care provider.   Document Released: 07/09/2005 Document Revised: 10/04/2013 Document Reviewed: 06/06/2013 Elsevier Interactive Patient Education 2016 [employer]vier  Inc[employer] ° °

## 2016-03-16 LAB — RPR: RPR: NONREACTIVE

## 2016-03-16 LAB — HIV ANTIBODY (ROUTINE TESTING W REFLEX): HIV Screen 4th Generation wRfx: NONREACTIVE

## 2016-03-17 ENCOUNTER — Telehealth: Payer: Self-pay | Admitting: *Deleted

## 2016-03-17 ENCOUNTER — Inpatient Hospital Stay (HOSPITAL_COMMUNITY)
Admission: AD | Admit: 2016-03-17 | Discharge: 2016-03-17 | Disposition: A | Discharge status: Home / Self Care | Payer: BLUE CROSS/BLUE SHIELD | Source: Ambulatory Visit | Attending: Obstetrics & Gynecology | Admitting: Obstetrics & Gynecology

## 2016-03-17 ENCOUNTER — Encounter (HOSPITAL_COMMUNITY): Payer: Self-pay | Admitting: Student

## 2016-03-17 ENCOUNTER — Inpatient Hospital Stay (HOSPITAL_COMMUNITY): Payer: BLUE CROSS/BLUE SHIELD

## 2016-03-17 DIAGNOSIS — O209 Hemorrhage in early pregnancy, unspecified: Secondary | ICD-10-CM | POA: Diagnosis present

## 2016-03-17 DIAGNOSIS — O4691 Antepartum hemorrhage, unspecified, first trimester: Secondary | ICD-10-CM

## 2016-03-17 DIAGNOSIS — Z3A01 Less than 8 weeks gestation of pregnancy: Secondary | ICD-10-CM | POA: Diagnosis not present

## 2016-03-17 DIAGNOSIS — O2341 Unspecified infection of urinary tract in pregnancy, first trimester: Secondary | ICD-10-CM | POA: Diagnosis not present

## 2016-03-17 DIAGNOSIS — O3680X0 Pregnancy with inconclusive fetal viability, not applicable or unspecified: Secondary | ICD-10-CM

## 2016-03-17 DIAGNOSIS — Z87891 Personal history of nicotine dependence: Secondary | ICD-10-CM | POA: Insufficient documentation

## 2016-03-17 HISTORY — DX: Papillomavirus as the cause of diseases classified elsewhere: B97.7

## 2016-03-17 LAB — GC/CHLAMYDIA PROBE AMP (~~LOC~~) NOT AT ARMC
CHLAMYDIA, DNA PROBE: NEGATIVE
Neisseria Gonorrhea: NEGATIVE

## 2016-03-17 LAB — CBC
HEMATOCRIT: 38.4 % (ref 36.0–46.0)
HEMOGLOBIN: 13.3 g/dL (ref 12.0–15.0)
MCH: 29.4 pg (ref 26.0–34.0)
MCHC: 34.6 g/dL (ref 30.0–36.0)
MCV: 85 fL (ref 78.0–100.0)
Platelets: 216 10*3/uL (ref 150–400)
RBC: 4.52 MIL/uL (ref 3.87–5.11)
RDW: 14.2 % (ref 11.5–15.5)
WBC: 7.1 10*3/uL (ref 4.0–10.5)

## 2016-03-17 LAB — URINALYSIS, ROUTINE W REFLEX MICROSCOPIC
Glucose, UA: NEGATIVE mg/dL
Ketones, ur: 15 mg/dL — AB
Leukocytes, UA: NEGATIVE
NITRITE: NEGATIVE
PROTEIN: NEGATIVE mg/dL
Specific Gravity, Urine: 1.025 (ref 1.005–1.030)
pH: 5.5 (ref 5.0–8.0)

## 2016-03-17 LAB — HCG, QUANTITATIVE, PREGNANCY: HCG, BETA CHAIN, QUANT, S: 1124 m[IU]/mL — AB (ref <5)

## 2016-03-17 LAB — URINE MICROSCOPIC-ADD ON

## 2016-03-17 NOTE — MAU Provider Note (Signed)
History     CSN: [id number]  Arrival date and time: 03/17/16 1032   First Provider Initiated Contact with Patient 03/17/16 1333      Chief Complaint  Patient presents with  . Abdominal Pain  . Vaginal Bleeding   HPI [name] is 30 y.o. G1P0 [redacted]w[redacted]d weeks presenting with vaginal bleeding.  LMP 02/12/2016.Was seen 6/1 at [facility] requesting pregnancy testing. Had pink discharge several days prior to that visit. + for UTI at that visit, is still taking antibiotic for dx.  On 6/3 she had evaluation at [facility] ED for vaginal bleeding.  On that date, BHCG 834, U/S did not see Intrauterine Gestation or ectopic.  Bleeding stopped but she began with light pink vaginal bleeding today at work.  This pregnancy is wanted.   Past Medical History  Diagnosis Date  . HPV (human papilloma virus) infection     Past Surgical History  Procedure Laterality Date  . Wisdom tooth extraction      History reviewed. No pertinent family history.  Social History  Substance Use Topics  . Smoking status: Former Smoker -- 0.50 packs/day    Types: Cigarettes    Quit date: 03/13/2016  . Smokeless tobacco: Never Used  . Alcohol Use: 0.0 oz/week    0 Standard drinks or equivalent per week     Comment: occ when not pregnant    Allergies: No Known Allergies  Prescriptions prior to admission  Medication Sig Dispense Refill Last Dose  . acetaminophen (TYLENOL) 500 MG tablet Take 500 mg by mouth every 6 (six) hours as needed for moderate pain.   Past Month at Unknown time  . nitrofurantoin, macrocrystal-monohydrate, (MACROBID) 100 MG capsule Take 1 capsule (100 mg total) by mouth 2 (two) times daily. 14 capsule 0 03/17/2016 at 830  . OVER THE COUNTER MEDICATION 1 drop as needed. Patient uses an over the counter eye drop for dry eyes as needed       Review of Systems  Constitutional: Negative for fever and chills.  Gastrointestinal: Negative for nausea, vomiting and abdominal pain.   Genitourinary:       + for vaginal bleeding.  Neg for vaginal discharge.   Physical Exam   Blood pressure 134/91, pulse 112, temperature 98.8 F (37.1 C), temperature source Oral, resp. rate 18, height  (1.676 m), weight 198 lb 6.4 oz (89.994 kg), last menstrual period 02/12/2016.  Physical Exam  Constitutional: She is oriented to person, place, and time. She appears well-developed and well-nourished. No distress.  HENT:  Head: Normocephalic.  Neck: Normal range of motion.  Cardiovascular: Normal rate.   Respiratory: Effort normal.  GI: Soft. She exhibits no distension and no mass. There is no tenderness. There is no rebound and no guarding.  Genitourinary: There is no rash, tenderness or lesion on the right labia. There is lesion (small area that appears to be HPV on edge of left labia minor--known HPV) on the left labia. There is no rash or tenderness on the left labia. Uterus is not enlarged and not tender. Cervix exhibits no motion tenderness, no discharge and no friability. Right adnexum displays no mass, no tenderness and no fullness. Left adnexum displays no mass, no tenderness and no fullness. There is bleeding (small amount of thin, dark pink blood noted in canal.  Neg for clots) in the vagina. No erythema or tenderness in the vagina. No vaginal discharge found.  Neurological: She is alert and oriented to person, place, and time.  Skin: Skin is warm and dry.  Psychiatric: She has a normal mood and affect. Her behavior is normal. Thought content normal.   Results for orders placed or performed during the hospital encounter of 03/17/16 (from the past 24 hour(s))  CBC     Status: None   Collection Time: 03/17/16 10:51 AM  Result Value Ref Range   WBC 7.1 4.0 - 10.5 K/uL   RBC 4.52 3.87 - 5.11 MIL/uL   Hemoglobin 13.3 12.0 - 15.0 g/dL   HCT 16.1 09.6 - 04.5 %   MCV 85.0 78.0 - 100.0 fL   MCH 29.4 26.0 - 34.0 pg   MCHC 34.6 30.0 - 36.0 g/dL   RDW 40.9 81.1 - 91.4 %    Platelets 216 150 - 400 K/uL  hCG, quantitative, pregnancy     Status: Abnormal   Collection Time: 03/17/16 10:51 AM  Result Value Ref Range   hCG, Beta Chain, Quant, S 1124 (H) <5 mIU/mL  Urinalysis, Routine w reflex microscopic (not at [facility])     Status: Abnormal   Collection Time: 03/17/16 11:12 AM  Result Value Ref Range   Color, Urine YELLOW YELLOW   APPearance CLEAR CLEAR   Specific Gravity, Urine 1.025 1.005 - 1.030   pH 5.5 5.0 - 8.0   Glucose, UA NEGATIVE NEGATIVE mg/dL   Hgb urine dipstick MODERATE (A) NEGATIVE   Bilirubin Urine SMALL (A) NEGATIVE   Ketones, ur 15 (A) NEGATIVE mg/dL   Protein, ur NEGATIVE NEGATIVE mg/dL   Nitrite NEGATIVE NEGATIVE   Leukocytes, UA NEGATIVE NEGATIVE  Urine microscopic-add on     Status: Abnormal   Collection Time: 03/17/16 11:12 AM  Result Value Ref Range   Squamous Epithelial / LPF 6-30 (A) NONE SEEN   WBC, UA 0-5 0 - 5 WBC/hpf   RBC / HPF 0-5 0 - 5 RBC/hpf   Bacteria, UA RARE (A) NONE SEEN   ABO RH- A positive on 03/15/2016 GC/CHL on 6/1 Neg/Neg + clue cells on wet prep 03/15/2016 US Ob Comp Less 14 Wks  03/17/2016  CLINICAL DATA:  Spotting and cramping. EXAM: OBSTETRIC <14 WK [place] AND TRANSVAGINAL OB US TECHNIQUE: Both transabdominal and transvaginal ultrasound examinations were performed for complete evaluation of the gestation as well as the maternal uterus, adnexal regions, and pelvic cul-de-sac. Transvaginal technique was performed to assess early pregnancy. COMPARISON:  None. FINDINGS: Intrauterine gestational sac: None Yolk sac:  No Embryo:  No Maternal uterus/adnexae: Right ovary: Normal Left ovary: Normal Other :Within the left adnexa there is a indeterminate mass separate from the ovary which measures 3.1 x 1.8 x 1.3 cm. On the dynamic images this appears to move independent from the adjacent ovary. Posterior fundal fibroid measures 4.3 x 4.0 x 3.5 cm. Free fluid:  Small amount of free fluid identified within the pelvis IMPRESSION: 1. No  evidence for intrauterine gestation. 2. Indeterminate mass within the left adnexa, adjacent to the right ovary. Cannot rule out ectopic pregnancy. Critical Value/emergent results were called by telephone at the time of interpretation on 03/17/2016 at 3:01 pm to Judeth Horn who verbally acknowledged these results. Electronically Signed   By: Signa Kell [name]   On: 03/17/2016 15:03   US Ob Transvaginal  03/17/2016  CLINICAL DATA:  Spotting and cramping. EXAM: OBSTETRIC <14 WK [place] AND TRANSVAGINAL OB US TECHNIQUE: Both transabdominal and transvaginal ultrasound examinations were performed for complete evaluation of the gestation as well as the maternal uterus, adnexal regions, and pelvic cul-de-sac. Transvaginal technique  was performed to assess early pregnancy. COMPARISON:  None. FINDINGS: Intrauterine gestational sac: None Yolk sac:  No Embryo:  No Maternal uterus/adnexae: Right ovary: Normal Left ovary: Normal Other :Within the left adnexa there is a indeterminate mass separate from the ovary which measures 3.1 x 1.8 x 1.3 cm. On the dynamic images this appears to move independent from the adjacent ovary. Posterior fundal fibroid measures 4.3 x 4.0 x 3.5 cm. Free fluid:  Small amount of free fluid identified within the pelvis IMPRESSION: 1. No evidence for intrauterine gestation. 2. Indeterminate mass within the left adnexa, adjacent to the right ovary. Cannot rule out ectopic pregnancy. Critical Value/emergent results were called by telephone at the time of interpretation on 03/17/2016 at 3:01 pm to Judeth HornLAWRENCE, ERIN who verbally acknowledged these results. Electronically Signed   By: Signa Kellaylor  Stroud [name]   On: 03/17/2016 15:03    MAU Course  Procedures  MDM MSE Labs Exam U/S Called Dr. Debroah LoopArnold to discuss patient care.  He is in OR and will return call when out.  Patient is NOT acute.  She is aware of U/S findings and that I am waiting to speak with Dr. Debroah LoopArnold.   Dr. Debroah LoopArnold in unit to review patient's  visit.  With BHCG < 1500,  patient who is not having pain and very wanted pregnancy, will repeat BHCG in 48 hrs.  Strict ectopic precautions reviewed with patient and will return before that time if pain or increased bleeding.  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  Matt HolmesKEY,EVE M 03/17/2016, 4:13 PM

## 2016-03-17 NOTE — MAU Note (Signed)
On rx for UTI

## 2016-03-17 NOTE — Telephone Encounter (Signed)
Patient left message that she needed a 2 day follow up. 11:54 Patient is at the hospital currently for assessment of bleeding in early pregnancy.

## 2016-03-17 NOTE — Discharge Instructions (Signed)
First Trimester of Pregnancy  The first trimester of pregnancy is from week 1 until the end of week 12 (months 1 through 3). A week after a sperm fertilizes an egg, the egg will implant on the wall of the uterus. This embryo will begin to develop into a baby. Genes from you and your partner are forming the baby. The female genes determine whether the baby is a boy or a girl. At 6-8 weeks, the eyes and face are formed, and the heartbeat can be seen on ultrasound. At the end of 12 weeks, all the baby's organs are formed.   Now that you are pregnant, you will want to do everything you can to have a healthy baby. Two of the most important things are to get good prenatal care and to follow your health care provider's instructions. Prenatal care is all the medical care you receive before the baby's birth. This care will help prevent, find, and treat any problems during the pregnancy and childbirth.  BODY CHANGES  Your body goes through many changes during pregnancy. The changes vary from woman to woman.   · You may gain or lose a couple of pounds at first.  · You may feel sick to your stomach (nauseous) and throw up (vomit). If the vomiting is uncontrollable, call your health care provider.  · You may tire easily.  · You may develop headaches that can be relieved by medicines approved by your health care provider.  · You may urinate more often. Painful urination may mean you have a bladder infection.  · You may develop heartburn as a result of your pregnancy.  · You may develop constipation because certain hormones are causing the muscles that push waste through your intestines to slow down.  · You may develop hemorrhoids or swollen, bulging veins (varicose veins).  · Your breasts may begin to grow larger and become tender. Your nipples may stick out more, and the tissue that surrounds them (areola) may become darker.  · Your gums may bleed and may be sensitive to brushing and flossing.   · Dark spots or blotches (chloasma, mask of pregnancy) may develop on your face. This will likely fade after the baby is born.  · Your menstrual periods will stop.  · You may have a loss of appetite.  · You may develop cravings for certain kinds of food.  · You may have changes in your emotions from day to day, such as being excited to be pregnant or being concerned that something may go wrong with the pregnancy and baby.  · You may have more vivid and strange dreams.  · You may have changes in your hair. These can include thickening of your hair, rapid growth, and changes in texture. Some women also have hair loss during or after pregnancy, or hair that feels dry or thin. Your hair will most likely return to normal after your baby is born.  WHAT TO EXPECT AT YOUR PRENATAL VISITS  During a routine prenatal visit:  · You will be weighed to make sure you and the baby are growing normally.  · Your blood pressure will be taken.  · Your abdomen will be measured to track your baby's growth.  · The fetal heartbeat will be listened to starting around week 10 or 12 of your pregnancy.  · Test results from any previous visits will be discussed.  Your health care provider may ask you:  · How you are feeling.  · If you   including cigarettes, chewing tobacco, and electronic cigarettes. °· If you have any questions. °Other tests that may be performed during your first trimester include: °· Blood tests to find your blood type and to check for the presence of any previous infections. They will also be used to check for low iron levels (anemia) and Rh antibodies. Later in the pregnancy, blood tests for diabetes will be done along with other tests if problems develop. °· Urine tests to check for infections, diabetes, or protein in the urine. °· An ultrasound to confirm the proper growth  and development of the baby. °· An amniocentesis to check for possible genetic problems. °· Fetal screens for spina bifida and Down syndrome. °· You may need other tests to make sure you and the baby are doing well. °· HIV (human immunodeficiency virus) testing. Routine prenatal testing includes screening for HIV, unless you choose not to have this test. °HOME CARE INSTRUCTIONS  °Medicines °· Follow your health care provider's instructions regarding medicine use. Specific medicines may be either safe or unsafe to take during pregnancy. °· Take your prenatal vitamins as directed. °· If you develop constipation, try taking a stool softener if your health care provider approves. °Diet °· Eat regular, well-balanced meals. Choose a variety of foods, such as meat or vegetable-based protein, fish, milk and low-fat dairy products, vegetables, fruits, and whole grain breads and cereals. Your health care provider will help you determine the amount of weight gain that is right for you. °· Avoid raw meat and uncooked cheese. These carry germs that can cause birth defects in the baby. °· Eating four or five small meals rather than three large meals a day may help relieve nausea and vomiting. If you start to feel nauseous, eating a few soda crackers can be helpful. Drinking liquids between meals instead of during meals also seems to help nausea and vomiting. °· If you develop constipation, eat more high-fiber foods, such as fresh vegetables or fruit and whole grains. Drink enough fluids to keep your urine clear or pale yellow. °Activity and Exercise °· Exercise only as directed by your health care provider. Exercising will help you: °¨ Control your weight. °¨ Stay in shape. °¨ Be prepared for labor and delivery. °· Experiencing pain or cramping in the lower abdomen or low back is a good sign that you should stop exercising. Check with your health care provider before continuing normal exercises. °· Try to avoid standing for long  periods of time. Move your legs often if you must stand in one place for a long time. °· Avoid heavy lifting. °· Wear low-heeled shoes, and practice good posture. °· You may continue to have sex unless your health care provider directs you otherwise. °Relief of Pain or Discomfort °· Wear a good support bra for breast tenderness.   °· Take warm sitz baths to soothe any pain or discomfort caused by hemorrhoids. Use hemorrhoid cream if your health care provider approves.   °· Rest with your legs elevated if you have leg cramps or low back pain. °· If you develop varicose veins in your legs, wear support hose. Elevate your feet for 15 minutes, 3-4 times a day. Limit salt in your diet. °Prenatal Care °· Schedule your prenatal visits by the twelfth week of pregnancy. They are usually scheduled monthly at first, then more often in the last 2 months before delivery. °· Write down your questions. Take them to your prenatal visits. °· Keep all your prenatal visits as directed by your   health care provider. Safety  Wear your seat belt at all times when driving.  Make a list of emergency phone numbers, including numbers for family, friends, the hospital, and police and fire departments. General Tips  Ask your health care provider for a referral to a local prenatal education class. Begin classes no later than at the beginning of month 6 of your pregnancy.  Ask for help if you have counseling or nutritional needs during pregnancy. Your health care provider can offer advice or refer you to specialists for help with various needs.  Do not use hot tubs, steam rooms, or saunas.  Do not douche or use tampons or scented sanitary pads.  Do not cross your legs for long periods of time.  Avoid cat litter boxes and soil used by cats. These carry germs that can cause birth defects in the baby and possibly loss of the fetus by miscarriage or stillbirth.  Avoid all smoking, herbs, alcohol, and medicines not prescribed by  your health care provider. Chemicals in these affect the formation and growth of the baby.  Do not use any tobacco products, including cigarettes, chewing tobacco, and electronic cigarettes. If you need help quitting, ask your health care provider. You may receive counseling support and other resources to help you quit.  Schedule a dentist appointment. At home, brush your teeth with a soft toothbrush and be gentle when you floss. SEEK MEDICAL CARE IF:   You have dizziness.  You have mild pelvic cramps, pelvic pressure, or nagging pain in the abdominal area.  You have persistent nausea, vomiting, or diarrhea.  You have a bad smelling vaginal discharge.  You have pain with urination.  You notice increased swelling in your face, hands, legs, or ankles. SEEK IMMEDIATE MEDICAL CARE IF:   You have a fever.  You are leaking fluid from your vagina.  You have spotting or bleeding from your vagina.  You have severe abdominal cramping or pain.  You have rapid weight gain or loss.  You vomit blood or material that looks like coffee grounds.  You are exposed to [place] measles and have never had them.  You are exposed to fifth disease or chickenpox.  You develop a severe headache.  You have shortness of breath.  You have any kind of trauma, such as from a fall or a car accident.   This information is not intended to replace advice given to you by your health care provider. Make sure you discuss any questions you have with your health care provider.   Document Released: 09/23/2001 Document Revised: 10/20/2014 Document Reviewed: 08/09/2013 Elsevier Interactive Patient Education [employer] Inc[employer] Ectopic Pregnancy An ectopic pregnancy happens when a fertilized egg grows outside the uterus. A pregnancy cannot live outside of the uterus. This problem often happens in the fallopian tube. It is often caused by damage to the fallopian tube. If this problem is found early, you may be  treated with medicine. If your tube tears or bursts open (ruptures), you will bleed inside. This is an emergency. You will need surgery. Get help right away.  SYMPTOMS You may have normal pregnancy symptoms at first. These include:  Missing your period.  Feeling sick to your stomach (nauseous).  Being tired.  Having tender breasts. Then, you may start to have symptoms that are not normal. These include:  Pain with sex (intercourse).  Bleeding from the vagina. This includes light bleeding (spotting).  Belly (abdomen) or lower belly cramping or pain. This may be felt  on one side.  A fast heartbeat (pulse).  Passing out (fainting) after going poop (bowel movement). If your tube tears, you may have symptoms such as:  Really bad pain in the belly or lower belly. This happens suddenly.  Dizziness.  Passing out.  Shoulder pain. GET HELP RIGHT AWAY IF:  You have any of these symptoms. This is an emergency. MAKE SURE YOU:  Understand these instructions.  Will watch your condition.  Will get help right away if you are not doing well or get worse.   This information is not intended to replace advice given to you by your health care provider. Make sure you discuss any questions you have with your health care provider.   Document Released: 12/26/2008 Document Revised: 10/04/2013 Document Reviewed: 05/11/2013 Elsevier Interactive Patient Education [employer] Inc.

## 2016-03-17 NOTE — MAU Note (Signed)
Went to ER on Sat for spotting, had blood work done. Bleeding stopped, went to work today- started spotting and cramping again.

## 2016-03-19 ENCOUNTER — Inpatient Hospital Stay (HOSPITAL_COMMUNITY)
Admission: AD | Admit: 2016-03-19 | Discharge: 2016-03-19 | Disposition: A | Discharge status: Home / Self Care | Payer: BLUE CROSS/BLUE SHIELD | Source: Ambulatory Visit | Attending: Obstetrics and Gynecology | Admitting: Obstetrics and Gynecology

## 2016-03-19 ENCOUNTER — Inpatient Hospital Stay (HOSPITAL_COMMUNITY): Payer: BLUE CROSS/BLUE SHIELD

## 2016-03-19 DIAGNOSIS — O009 Unspecified ectopic pregnancy without intrauterine pregnancy: Secondary | ICD-10-CM | POA: Diagnosis not present

## 2016-03-19 DIAGNOSIS — O001 Tubal pregnancy without intrauterine pregnancy: Secondary | ICD-10-CM | POA: Diagnosis present

## 2016-03-19 DIAGNOSIS — O3680X0 Pregnancy with inconclusive fetal viability, not applicable or unspecified: Secondary | ICD-10-CM

## 2016-03-19 LAB — CBC WITH DIFFERENTIAL/PLATELET
Basophils Absolute: 0 10*3/uL (ref 0.0–0.1)
Basophils Relative: 0 %
Eosinophils Absolute: 0.1 10*3/uL (ref 0.0–0.7)
Eosinophils Relative: 2 %
HCT: 37.8 % (ref 36.0–46.0)
HEMOGLOBIN: 13.2 g/dL (ref 12.0–15.0)
LYMPHS ABS: 2.2 10*3/uL (ref 0.7–4.0)
LYMPHS PCT: 50 %
MCH: 29.9 pg (ref 26.0–34.0)
MCHC: 34.9 g/dL (ref 30.0–36.0)
MCV: 85.7 fL (ref 78.0–100.0)
Monocytes Absolute: 0.7 10*3/uL (ref 0.1–1.0)
Monocytes Relative: 15 %
NEUTROS PCT: 33 %
Neutro Abs: 1.5 10*3/uL — ABNORMAL LOW (ref 1.7–7.7)
Platelets: 232 10*3/uL (ref 150–400)
RBC: 4.41 MIL/uL (ref 3.87–5.11)
RDW: 14.2 % (ref 11.5–15.5)
WBC: 4.6 10*3/uL (ref 4.0–10.5)

## 2016-03-19 LAB — CREATININE, SERUM: Creatinine, Ser: 0.77 mg/dL (ref 0.44–1.00)

## 2016-03-19 LAB — BUN: BUN: 7 mg/dL (ref 6–20)

## 2016-03-19 LAB — HCG, QUANTITATIVE, PREGNANCY: hCG, Beta Chain, Quant, S: 2153 m[IU]/mL — ABNORMAL HIGH (ref <5)

## 2016-03-19 LAB — AST: AST: 18 U/L (ref 15–41)

## 2016-03-19 MED ORDER — ACETAMINOPHEN 500 MG PO TABS
1000.0000 mg | ORAL_TABLET | Freq: Once | ORAL | Status: AC
  Administered 2016-03-19: 1000 mg via ORAL
  Filled 2016-03-19: qty 2

## 2016-03-19 MED ORDER — METHOTREXATE INJECTION FOR WOMEN'S HOSPITAL
50.0000 mg/m2 | Freq: Once | INTRAMUSCULAR | Status: AC
  Administered 2016-03-19: 105 mg via INTRAMUSCULAR
  Filled 2016-03-19: qty 2.1

## 2016-03-19 NOTE — MAU Note (Signed)
Here for f/u blood work.  Bad headache last 2 days.  Denies any abd pain or bleeding.

## 2016-03-19 NOTE — MAU Provider Note (Signed)
[name]  is a 30 y.o. G1P0 at 4893w1d who presents to MAU today for follow-up quant hCG after 48 hours. The patient had a suspicious adnexal mass noted on US at last visit in MAU. Quant hCG was 1124 at that time. This is a very desired pregnancy, so patient was advised to return to MAU instead of [facility] for STAT labs at 48 hours instead of tomorrow based on US results. The patient denies abdominal pain or bleeding today. She does endorse moderate to severe headache x 2 days. She has not taken anything for pain.   BP 132/72 mmHg  Pulse 88  Temp(Src) 99.2 F (37.3 C) (Oral)  Resp 16  LMP 02/12/2016  CONSTITUTIONAL: Well-developed, well-nourished female in no acute distress.  ENT: External right and left ear normal.  EYES: EOM intact, conjunctivae normal.  MUSCULOSKELETAL: Normal range of motion.  CARDIOVASCULAR: Regular heart rate RESPIRATORY: Normal effort NEUROLOGICAL: Alert and oriented to person, place, and time.  SKIN: Skin is warm and dry. No rash noted. Not diaphoretic. No erythema. No pallor. PSYCH: Normal mood and affect. Normal behavior. Normal judgment and thought content.  Results for [name], Barbara N (MRN [id number]) as of 03/19/2016 15:07  Ref. Range 03/17/2016 10:51 03/17/2016 11:12 03/17/2016 14:37 03/19/2016 13:58  HCG, Beta Chain, Quant, S Latest Ref Range: <5 mIU/mL 1124 (H)   2153 (H)   [place] Ob Transvaginal  03/19/2016  CLINICAL DATA:  Vaginal bleeding. Quantitative beta HCG 2,153 (1,124 on 03/17/2016). Estimated gestational age [redacted] weeks 1 day per LMP. EXAM: TRANSVAGINAL OB ULTRASOUND TECHNIQUE: Transvaginal ultrasound was performed for complete evaluation of the gestation as well as the maternal uterus, adnexal regions, and pelvic cul-de-sac. COMPARISON:  03/15/2016 and 03/17/2016 FINDINGS: Intrauterine gestational sac: Not visualized. Yolk sac:  Not visualized. Embryo:  Not visualized. Cardiac Activity: Not visualized. Heart Rate: Not visualized. Maternal uterus/adnexae: Uterus is  normal size, shape and position again demonstrates a right-sided posterior fundal fibroid measuring 4.6 cm unchanged. Endometrium measures 9 mm. Right ovary is normal in size, shape and position with normal vascularity. Left ovary is normal in size, shape and position with normal vascularity. There is a mass within the left adnexa which appears separate from the left ovary as seen previously and measures 1.7 x 1.7 x 2.6 cm with mild vascularity. This is not significantly changed in size, but has a central hypoechoic focus possibly a gestational sac. No yolk sac or embryo identified. This likely represents a left tubal ectopic pregnancy. Mild amount of free pelvic fluid. IMPRESSION: No evidence of IUP. Mass in the left adnexa separate from the left ovary measuring 1.7 x 1.7 x 2.6 cm with mild vascularity without significant change from the prior exam. Given patient's history, this likely represents a left-sided tubal ectopic pregnancy. Posterior right fundal fibroid measuring 4.6 cm. Critical Value/emergent results were called by telephone at the time of interpretation on 03/19/2016 at 5:07 pm to Dr. Emelda FearFerguson, who verbally acknowledged these results. Electronically Signed   By: Elberta Fortisaniel  Boyle [name]   On: 03/19/2016 17:07    MDM Discussed patient and results with Dr. Vergie LivingPickens. Although appropriate rise in quant hCG, given US results from 03/17/16 with adnexal mass, he recommends US today for re-evaluation.  Transvaginal OB US ordered  Critical value results called to Dr. Emelda FearFerguson. Discussed results with Dr. Emelda FearFerguson. He recommends MTX at this time. Labs ordered.  Discussed results with patient. Patient agrees with plan for MTX today. Patient is upset as this was a desired  pregnancy.  Patient received MTX  A: Ectopic pregnancy, left  P: Discharge home in stable condition Tylenol PRN for pain advised Ectopic precautions discussed Patient advised to follow-up in MAU on Saturday (day #4) for follow-up labs or  sooner if her condition were to change or worsen  Marny Lowenstein, PA-C 03/19/2016 5:37 PM

## 2016-03-19 NOTE — Discharge Instructions (Signed)
Methotrexate Treatment for an Ectopic Pregnancy, Care After °Refer to this sheet in the next few weeks. These instructions provide you with information on caring for yourself after your procedure. Your health care provider may also give you more specific instructions. Your treatment has been planned according to current medical practices, but problems sometimes occur. Call your health care provider if you have any problems or questions after your procedure. °WHAT TO EXPECT AFTER THE PROCEDURE °You may have some abdominal cramping, vaginal bleeding, and fatigue in the first few days after taking methotrexate. Some other possible side effects of methotrexate include: °· Nausea. °· Vomiting. °· Diarrhea. °· Mouth sores. °· Swelling or irritation of the lining of your lungs (pneumonitis). °· Liver damage. °· Hair loss. °HOME CARE INSTRUCTIONS  °After you have received the methotrexate medicine, you need to be careful of your activities and watch your condition for several weeks. It may take 1 week before your hormone levels return to normal. °· Keep all follow-up appointments as directed by your health care provider. °· Avoid traveling too far away from your health care provider. °· Do not have sexual intercourse until your health care provider says it is safe to do so. °· You may resume your usual diet. °· Limit strenuous activity. °· Do not take folic acid, prenatal vitamins, or other vitamins that contain folic acid. °· Do not take aspirin, ibuprofen, or naproxen (nonsteroidal anti-inflammatory drugs [NSAIDs]). °· Do not drink alcohol. °SEEK MEDICAL CARE IF:  °· You cannot control your nausea and vomiting. °· You cannot control your diarrhea. °· You have sores in your mouth and want treatment. °· You need pain medicine for your abdominal pain. °· You have a rash. °· You are having a reaction to the medicine. °SEEK IMMEDIATE MEDICAL CARE IF:  °· You have increasing abdominal or pelvic pain. °· You notice increased  bleeding. °· You feel light-headed, or you faint. °· You have shortness of breath. °· Your heart rate increases. °· You have a cough. °· You have chills. °· You have a fever. °  °This information is not intended to replace advice given to you by your health care provider. Make sure you discuss any questions you have with your health care provider. °  °Document Released: 09/18/2011 Document Revised: 10/04/2013 Document Reviewed: 07/18/2013 °Elsevier Interactive Patient Education ©2016 [employer] ° °

## 2016-03-27 ENCOUNTER — Encounter: Payer: Self-pay | Admitting: Obstetrics & Gynecology

## 2016-03-27 ENCOUNTER — Inpatient Hospital Stay (HOSPITAL_COMMUNITY): Payer: BLUE CROSS/BLUE SHIELD | Admitting: Anesthesiology

## 2016-03-27 ENCOUNTER — Encounter (HOSPITAL_COMMUNITY): Payer: Self-pay | Admitting: *Deleted

## 2016-03-27 ENCOUNTER — Inpatient Hospital Stay (HOSPITAL_COMMUNITY): Payer: BLUE CROSS/BLUE SHIELD

## 2016-03-27 ENCOUNTER — Encounter (HOSPITAL_COMMUNITY)
Admission: AD | Disposition: A | Discharge status: Home / Self Care | Payer: Self-pay | Source: Ambulatory Visit | Attending: Obstetrics & Gynecology

## 2016-03-27 ENCOUNTER — Ambulatory Visit (HOSPITAL_COMMUNITY)
Admission: AD | Admit: 2016-03-27 | Discharge: 2016-03-27 | Disposition: A | Discharge status: Home / Self Care | Payer: BLUE CROSS/BLUE SHIELD | Source: Ambulatory Visit | Attending: Obstetrics & Gynecology | Admitting: Obstetrics & Gynecology

## 2016-03-27 DIAGNOSIS — O009 Unspecified ectopic pregnancy without intrauterine pregnancy: Secondary | ICD-10-CM

## 2016-03-27 DIAGNOSIS — O00102 Left tubal pregnancy without intrauterine pregnancy: Secondary | ICD-10-CM

## 2016-03-27 DIAGNOSIS — O001 Tubal pregnancy without intrauterine pregnancy: Secondary | ICD-10-CM | POA: Diagnosis not present

## 2016-03-27 DIAGNOSIS — F17210 Nicotine dependence, cigarettes, uncomplicated: Secondary | ICD-10-CM | POA: Diagnosis not present

## 2016-03-27 DIAGNOSIS — O26899 Other specified pregnancy related conditions, unspecified trimester: Secondary | ICD-10-CM

## 2016-03-27 DIAGNOSIS — K661 Hemoperitoneum: Secondary | ICD-10-CM

## 2016-03-27 DIAGNOSIS — R109 Unspecified abdominal pain: Secondary | ICD-10-CM

## 2016-03-27 HISTORY — PX: DIAGNOSTIC LAPAROSCOPY WITH REMOVAL OF ECTOPIC PREGNANCY: SHX6449

## 2016-03-27 LAB — ABO/RH: ABO/RH(D): A POS

## 2016-03-27 LAB — TYPE AND SCREEN
ABO/RH(D): A POS
Antibody Screen: NEGATIVE

## 2016-03-27 LAB — CBC
HEMATOCRIT: 36 % (ref 36.0–46.0)
Hemoglobin: 12.5 g/dL (ref 12.0–15.0)
MCH: 29.3 pg (ref 26.0–34.0)
MCHC: 34.7 g/dL (ref 30.0–36.0)
MCV: 84.3 fL (ref 78.0–100.0)
PLATELETS: 198 10*3/uL (ref 150–400)
RBC: 4.27 MIL/uL (ref 3.87–5.11)
RDW: 14.1 % (ref 11.5–15.5)
WBC: 6.2 10*3/uL (ref 4.0–10.5)

## 2016-03-27 LAB — HCG, QUANTITATIVE, PREGNANCY: HCG, BETA CHAIN, QUANT, S: 1594 m[IU]/mL — AB (ref <5)

## 2016-03-27 SURGERY — LAPAROSCOPY, WITH ECTOPIC PREGNANCY SURGICAL TREATMENT
Anesthesia: General | Site: Abdomen

## 2016-03-27 MED ORDER — ONDANSETRON HCL 4 MG/2ML IJ SOLN
INTRAMUSCULAR | Status: DC | PRN
  Administered 2016-03-27: 4 mg via INTRAVENOUS

## 2016-03-27 MED ORDER — FENTANYL CITRATE (PF) 100 MCG/2ML IJ SOLN
INTRAMUSCULAR | Status: DC | PRN
  Administered 2016-03-27 (×2): 50 ug via INTRAVENOUS
  Administered 2016-03-27: 100 ug via INTRAVENOUS
  Administered 2016-03-27: 150 ug via INTRAVENOUS
  Administered 2016-03-27: 100 ug via INTRAVENOUS

## 2016-03-27 MED ORDER — KETOROLAC TROMETHAMINE 30 MG/ML IJ SOLN
INTRAMUSCULAR | Status: AC
  Filled 2016-03-27: qty 1

## 2016-03-27 MED ORDER — FENTANYL CITRATE (PF) 250 MCG/5ML IJ SOLN
INTRAMUSCULAR | Status: AC
  Filled 2016-03-27: qty 5

## 2016-03-27 MED ORDER — MIDAZOLAM HCL 2 MG/2ML IJ SOLN
INTRAMUSCULAR | Status: DC | PRN
Start: 2016-03-27 — End: 2016-03-27
  Administered 2016-03-27: 2 mg via INTRAVENOUS

## 2016-03-27 MED ORDER — ROCURONIUM BROMIDE 100 MG/10ML IV SOLN
INTRAVENOUS | Status: DC | PRN
  Administered 2016-03-27: 20 mg via INTRAVENOUS
  Administered 2016-03-27 (×2): 10 mg via INTRAVENOUS

## 2016-03-27 MED ORDER — GLYCOPYRROLATE 0.2 MG/ML IJ SOLN
INTRAMUSCULAR | Status: DC | PRN
  Administered 2016-03-27: 0.6 mg via INTRAVENOUS

## 2016-03-27 MED ORDER — DEXAMETHASONE SODIUM PHOSPHATE 10 MG/ML IJ SOLN
INTRAMUSCULAR | Status: DC | PRN
  Administered 2016-03-27: 4 mg via INTRAVENOUS

## 2016-03-27 MED ORDER — MIDAZOLAM HCL 2 MG/2ML IJ SOLN
INTRAMUSCULAR | Status: AC
  Filled 2016-03-27: qty 2

## 2016-03-27 MED ORDER — DOCUSATE SODIUM 100 MG PO CAPS: 100.0000 mg | ORAL_CAPSULE | Freq: Two times a day (BID) | ORAL | Status: DC | PRN

## 2016-03-27 MED ORDER — LIDOCAINE HCL (CARDIAC) 20 MG/ML IV SOLN
INTRAVENOUS | Status: AC
  Filled 2016-03-27: qty 5

## 2016-03-27 MED ORDER — LACTATED RINGERS IR SOLN
Status: DC | PRN
  Administered 2016-03-27: 3000 mL

## 2016-03-27 MED ORDER — SUCCINYLCHOLINE CHLORIDE 200 MG/10ML IV SOSY
PREFILLED_SYRINGE | INTRAVENOUS | Status: DC | PRN
  Administered 2016-03-27: 120 mg via INTRAVENOUS

## 2016-03-27 MED ORDER — BUPIVACAINE HCL (PF) 0.5 % IJ SOLN
INTRAMUSCULAR | Status: DC | PRN
  Administered 2016-03-27: 4 mL
  Administered 2016-03-27: 3 mL

## 2016-03-27 MED ORDER — GLYCOPYRROLATE 0.2 MG/ML IJ SOLN
INTRAMUSCULAR | Status: AC
  Filled 2016-03-27: qty 4

## 2016-03-27 MED ORDER — HYDROMORPHONE HCL 1 MG/ML IJ SOLN
0.2500 mg | INTRAMUSCULAR | Status: DC | PRN
  Administered 2016-03-27 (×2): 0.5 mg via INTRAVENOUS

## 2016-03-27 MED ORDER — ONDANSETRON HCL 4 MG/2ML IJ SOLN
INTRAMUSCULAR | Status: AC
  Filled 2016-03-27: qty 2

## 2016-03-27 MED ORDER — OXYCODONE-ACETAMINOPHEN 5-325 MG PO TABS: 1.0000 | ORAL_TABLET | Freq: Four times a day (QID) | ORAL | Status: DC | PRN

## 2016-03-27 MED ORDER — PROPOFOL 10 MG/ML IV BOLUS
INTRAVENOUS | Status: AC
  Filled 2016-03-27: qty 20

## 2016-03-27 MED ORDER — PROMETHAZINE HCL 25 MG/ML IJ SOLN: 6.2500 mg | INTRAMUSCULAR | Status: DC | PRN

## 2016-03-27 MED ORDER — IBUPROFEN 600 MG PO TABS: 600.0000 mg | ORAL_TABLET | Freq: Four times a day (QID) | ORAL | Status: DC | PRN

## 2016-03-27 MED ORDER — PROPOFOL 10 MG/ML IV BOLUS
INTRAVENOUS | Status: DC | PRN
  Administered 2016-03-27: 50 mg via INTRAVENOUS
  Administered 2016-03-27: 150 mg via INTRAVENOUS

## 2016-03-27 MED ORDER — DEXAMETHASONE SODIUM PHOSPHATE 10 MG/ML IJ SOLN
INTRAMUSCULAR | Status: AC
  Filled 2016-03-27: qty 1

## 2016-03-27 MED ORDER — SUCCINYLCHOLINE CHLORIDE 20 MG/ML IJ SOLN
INTRAMUSCULAR | Status: AC
  Filled 2016-03-27: qty 1

## 2016-03-27 MED ORDER — NEOSTIGMINE METHYLSULFATE 10 MG/10ML IV SOLN
INTRAVENOUS | Status: DC | PRN
  Administered 2016-03-27: 3 mg via INTRAVENOUS

## 2016-03-27 MED ORDER — NEOSTIGMINE METHYLSULFATE 10 MG/10ML IV SOLN
INTRAVENOUS | Status: AC
  Filled 2016-03-27: qty 1

## 2016-03-27 MED ORDER — FAMOTIDINE IN NACL 20-0.9 MG/50ML-% IV SOLN
20.0000 mg | Freq: Once | INTRAVENOUS | Status: AC
  Administered 2016-03-27: 20 mg via INTRAVENOUS
  Filled 2016-03-27: qty 50

## 2016-03-27 MED ORDER — SOD CITRATE-CITRIC ACID 500-334 MG/5ML PO SOLN
30.0000 mL | Freq: Once | ORAL | Status: AC
  Administered 2016-03-27: 30 mL via ORAL
  Filled 2016-03-27: qty 15

## 2016-03-27 MED ORDER — LACTATED RINGERS IV SOLN
INTRAVENOUS | Status: DC | PRN
  Administered 2016-03-27 (×2): via INTRAVENOUS

## 2016-03-27 MED ORDER — LIDOCAINE HCL (CARDIAC) 20 MG/ML IV SOLN
INTRAVENOUS | Status: DC | PRN
  Administered 2016-03-27: 80 mg via INTRAVENOUS

## 2016-03-27 MED ORDER — LACTATED RINGERS IV SOLN
INTRAVENOUS | Status: DC
  Administered 2016-03-27 (×2): via INTRAVENOUS

## 2016-03-27 SURGICAL SUPPLY — 26 items
BAG SPEC RTRVL LRG 6X4 10 (ENDOMECHANICALS) ×1
CABLE HIGH FREQUENCY MONO STRZ (ELECTRODE) IMPLANT
CATH ROBINSON RED A/P 16FR (CATHETERS) ×3 IMPLANT
CLOTH BEACON ORANGE TIMEOUT ST (SAFETY) ×3 IMPLANT
DURAPREP 26ML APPLICATOR (WOUND CARE) ×3 IMPLANT
GLOVE BIO SURGEON STRL SZ 6.5 (GLOVE) ×2 IMPLANT
GLOVE BIO SURGEONS STRL SZ 6.5 (GLOVE) ×1
GLOVE BIOGEL PI IND STRL 7.0 (GLOVE) ×3 IMPLANT
GLOVE BIOGEL PI INDICATOR 7.0 (GLOVE) ×6
GLOVE ECLIPSE 7.0 STRL STRAW (GLOVE) ×7 IMPLANT
GOWN STRL REUS W/TWL LRG LVL3 (GOWN DISPOSABLE) ×8 IMPLANT
LIQUID BAND (GAUZE/BANDAGES/DRESSINGS) ×3 IMPLANT
NS IRRIG 1000ML POUR BTL (IV SOLUTION) ×1 IMPLANT
PACK LAPAROSCOPY BASIN (CUSTOM PROCEDURE TRAY) ×3 IMPLANT
POUCH SPECIMEN RETRIEVAL 10MM (ENDOMECHANICALS) ×2 IMPLANT
SCRUB PCMX 4 OZ (MISCELLANEOUS) ×6 IMPLANT
SET IRRIG TUBING LAPAROSCOPIC (IRRIGATION / IRRIGATOR) ×2 IMPLANT
SHEARS HARMONIC ACE PLUS 36CM (ENDOMECHANICALS) ×2 IMPLANT
SLEEVE XCEL OPT CAN 5 100 (ENDOMECHANICALS) ×3 IMPLANT
SUT MNCRL AB 4-0 PS2 18 (SUTURE) ×1 IMPLANT
SUT VICRYL 0 UR6 27IN ABS (SUTURE) ×2 IMPLANT
TOWEL OR 17X24 6PK STRL BLUE (TOWEL DISPOSABLE) ×6 IMPLANT
TRAY FOLEY CATH SILVER 14FR (SET/KITS/TRAYS/PACK) ×2 IMPLANT
TROCAR XCEL NON-BLD 11X100MML (ENDOMECHANICALS) ×2 IMPLANT
TROCAR XCEL NON-BLD 5MMX100MML (ENDOMECHANICALS) ×3 IMPLANT
WATER STERILE IRR 1000ML POUR (IV SOLUTION) ×1 IMPLANT

## 2016-03-27 NOTE — Anesthesia Procedure Notes (Signed)
Procedure Name: Intubation Date/Time: 03/27/2016 6:55 PM Performed by: Renford DillsMULLINS, Marc Sivertsen L Pre-anesthesia Checklist: Patient identified, Emergency Drugs available, Suction available and Patient being monitored Patient Re-evaluated:Patient Re-evaluated prior to inductionOxygen Delivery Method: Circle system utilized Preoxygenation: Pre-oxygenation with 100% oxygen Intubation Type: IV induction, Rapid sequence and Cricoid Pressure applied Laryngoscope Size: Miller and 2 Grade View: Grade I Tube size: 7.0 mm Number of attempts: 1 Airway Equipment and Method: Stylet Placement Confirmation: ETT inserted through vocal cords under direct vision,  positive ETCO2 and breath sounds checked- equal and bilateral Secured at: 21 cm Tube secured with: Tape Dental Injury: Teeth and Oropharynx as per pre-operative assessment

## 2016-03-27 NOTE — MAU Note (Signed)
Pt had MTX on 03/19/16 for ectopic, was to follow up on day 4, but has not returned until today.  Pt C/O lower abd pain & pressure that goes into her hips & down her thighs.  Has intermittent sharp pain in RLQ.  Denies bleeding.

## 2016-03-27 NOTE — Anesthesia Preprocedure Evaluation (Signed)
Anesthesia Evaluation  Patient identified by MRN, date of birth, ID band Patient awake    Reviewed: Allergy & Precautions, NPO status , Patient's Chart, lab work & pertinent test results  Airway Mallampati: II  TM Distance: >3 FB Neck ROM: Full    Dental  (+) Teeth Intact, Dental Advisory Given   Pulmonary Current Smoker,    Pulmonary exam normal        Cardiovascular negative cardio ROS Normal cardiovascular exam     Neuro/Psych negative neurological ROS  negative psych ROS   GI/Hepatic negative GI ROS, Neg liver ROS,   Endo/Other  negative endocrine ROS  Renal/GU negative Renal ROS  negative genitourinary   Musculoskeletal negative musculoskeletal ROS (+)   Abdominal   Peds negative pediatric ROS (+)  Hematology negative hematology ROS (+)   Anesthesia Other Findings   Reproductive/Obstetrics negative OB ROS                             Anesthesia Physical Anesthesia Plan  ASA: II and emergent  Anesthesia Plan: General   Post-op Pain Management:    Induction: Intravenous, Rapid sequence and Cricoid pressure planned  Airway Management Planned: Oral ETT  Additional Equipment:   Intra-op Plan:   Post-operative Plan: Extubation in OR  Informed Consent: I have reviewed the patients History and Physical, chart, labs and discussed the procedure including the risks, benefits and alternatives for the proposed anesthesia with the patient or authorized representative who has indicated his/her understanding and acceptance.   Dental advisory given  Plan Discussed with: CRNA, Anesthesiologist and Surgeon  Anesthesia Plan Comments:         Anesthesia Quick Evaluation

## 2016-03-27 NOTE — Anesthesia Postprocedure Evaluation (Signed)
Anesthesia Post Note  Patient: [name]  Procedure(s) Performed: Procedure(s) (LRB): Diagnostic  Laparoscopy,  Left Salpingectomy with Removal Ectopic Pregnancy (N/A)  Patient location during evaluation: PACU Anesthesia Type: General Level of consciousness: sedated Pain management: pain level controlled Vital Signs Assessment: post-procedure vital signs reviewed and stable Respiratory status: spontaneous breathing and respiratory function stable Cardiovascular status: stable Anesthetic complications: no     Last Vitals:  Filed Vitals:   03/27/16 2030 03/27/16 2045  BP: 121/66 122/62  Pulse: 82 83  Temp:  36.8 C  Resp: 20 18    Last Pain:  Filed Vitals:   03/27/16 2046  PainSc: 3    Pain Goal: Patients Stated Pain Goal: 0 (03/27/16 1614)               Hope Holst DANIEL

## 2016-03-27 NOTE — MAU Provider Note (Signed)
History     CSN: [id number]  Arrival date and time: 03/27/16 1550   None     Chief Complaint  Patient presents with  . Abdominal Pain   HPI   [name] G1P0 @ 5673w2d presenting to MAU with abdominal pain. She was diagnosed with a left adnexal mass (1.7x1.7x2.6 cm) on 6/7 and was given MTX. The patient never returned for her follow up [place] due to "depressive state".   She started experiencing pain today just a few hours ago. The pain is located in the right lower quadrant, she describes it as pressure. Every once in while she has a shooting pain in her pelvis and that is worse, she rates her pain 7/10.  She denies vaginal bleeding.   US from 6/7 shows   Mass in the left adnexa separate from the left ovary measuring 1.7 x 1.7 x 2.6 cm with mild vascularity without significant change from the prior exam. Given patient's history, this likely represents a left-sided tubal ectopic pregnancy.  OB History    Gravida Para Term Preterm AB TAB SAB Ectopic Multiple Living   1         0      Past Medical History  Diagnosis Date  . HPV (human papilloma virus) infection     Past Surgical History  Procedure Laterality Date  . Wisdom tooth extraction      History reviewed. No pertinent family history.  Social History  Substance Use Topics  . Smoking status: Current Every Day Smoker -- 0.25 packs/day    Types: Cigarettes    Last Attempt to Quit: 03/13/2016  . Smokeless tobacco: Never Used  . Alcohol Use: 0.0 oz/week    0 Standard drinks or equivalent per week     Comment: occ when not pregnant    Allergies: No Known Allergies  Prescriptions prior to admission  Medication Sig Dispense Refill Last Dose  . acetaminophen (TYLENOL) 500 MG tablet Take 1,000 mg by mouth every 6 (six) hours as needed for mild pain or moderate pain.    03/19/2016 at Unknown time   Results for orders placed or performed during the hospital encounter of 03/27/16 (from the past 48 hour(s))   hCG, quantitative, pregnancy     Status: Abnormal   Collection Time: 03/27/16  4:19 PM  Result Value Ref Range   hCG, Beta Chain, Quant, S 1594 (H) <5 mIU/mL    Comment:          GEST. AGE      CONC.  (mIU/mL)   <=1 WEEK        5 - 50     2 WEEKS       50 - 500     3 WEEKS       100 - 10,000     4 WEEKS     1,000 - 30,000     5 WEEKS     3,500 - 115,000   6-8 WEEKS     12,000 - 270,000    12 WEEKS     15,000 - 220,000        FEMALE AND NON-PREGNANT FEMALE:     LESS THAN 5 mIU/mL   CBC     Status: None   Collection Time: 03/27/16  4:19 PM  Result Value Ref Range   WBC 6.2 4.0 - 10.5 K/uL   RBC 4.27 3.87 - 5.11 MIL/uL   Hemoglobin 12.5 12.0 - 15.0 g/dL   HCT 40.936.0 81.136.0 -  46.0 %   MCV 84.3 78.0 - 100.0 fL   MCH 29.3 26.0 - 34.0 pg   MCHC 34.7 30.0 - 36.0 g/dL   RDW 16.1 09.6 - 04.5 %   Platelets 198 150 - 400 K/uL    Review of Systems  Constitutional: Positive for chills. Negative for fever.  Gastrointestinal: Negative for nausea and vomiting.   Physical Exam   Blood pressure 124/63, pulse 84, temperature 98 F (36.7 C), temperature source Oral, resp. rate 18, height 5' 6.5" (1.689 m), weight 200 lb (90.719 kg), last menstrual period 02/12/2016.  Physical Exam  Constitutional: She is oriented to person, place, and time. She appears well-developed and well-nourished. No distress.  HENT:  Head: Normocephalic.  Cardiovascular: Normal rate.   Respiratory: Effort normal.  GI: Soft. Normal appearance. There is tenderness in the right lower quadrant. There is rigidity. There is no rebound and no guarding.  Musculoskeletal: Normal range of motion.  Neurological: She is alert and oriented to person, place, and time.  Skin: Skin is warm. She is not diaphoretic.  Psychiatric: Her behavior is normal.    MAU Course  Procedures  None  MDM  CBC Quant [place]   Assessment and Plan   A:  1. Ruptured left tubal ectopic pregnancy causing hemoperitoneum   2. Ectopic pregnancy    3. Abdominal pain affecting pregnancy     P:  Dr. Macon Large to MAU to discuss OR plan.  Duane Lope, NP 03/27/2016 5:41 PM

## 2016-03-27 NOTE — H&P (Addendum)
Preoperative History and Physical  [name] is a 30 y.o. G1P0 here for surgical management of ruptured of left tubal ectopic pregnancy causing hemoperitoneum.  She had a known left ectopic pregnancy diagnosed on 03/19/16 and treated with Methotrexate. Day 1 HCG was 2153. She did not follow up for Day 4 and Day 7 labs, but come today (Day 8) reporting severe abdominal pain, 7/10 on a pain scale.   HCG today showed appropriate drop to 1594 and Hgb was stable at 12.5.  However, repeat ultrasound showed ruptured of left tubal ectopic pregnancy with moderate hemoperitoneum.  Proposed surgery: Emergent laparoscopic left salpingectomy and removal of ectopic pregnancy.  Past Medical History  Diagnosis Date  . HPV (human papilloma virus) infection    Past Surgical History  Procedure Laterality Date  . Wisdom tooth extraction     OB History  Gravida Para Term Preterm AB SAB TAB Ectopic Multiple Living  1         0    # Outcome Date GA Lbr Len/2nd Weight Sex Delivery Anes PTL Lv  1 Current             Patient denies any other pertinent gynecologic issues.   No current facility-administered medications on file prior to encounter.   Current Outpatient Prescriptions on File Prior to Encounter  Medication Sig Dispense Refill  . acetaminophen (TYLENOL) 500 MG tablet Take 1,000 mg by mouth every 6 (six) hours as needed for mild pain or moderate pain.      No Known Allergies  Social History:   reports that she has been smoking Cigarettes.  She has been smoking about 0.25 packs per day. She has never used smokeless tobacco. She reports that she drinks alcohol. She reports that she uses illicit drugs (Marijuana).  History reviewed. No pertinent family history.  Review of Systems: Noncontributory  PHYSICAL EXAM: Blood pressure 124/63, pulse 84, temperature 98 F (36.7 C), temperature source Oral, resp. rate 18, height 5' 6.5" (1.689 m), weight 200 lb (90.719 kg), last menstrual period  02/12/2016. CONSTITUTIONAL: Well-developed, well-nourished female in no acute distress.  HENT:  Normocephalic, atraumatic, External right and left ear normal. Oropharynx is clear and moist EYES: Conjunctivae and EOM are normal. Pupils are equal, round, and reactive to light. No scleral icterus.  NECK: Normal range of motion, supple, no masses SKIN: Skin is warm and dry. No rash noted. Not diaphoretic. No erythema. No pallor. NEUROLOGIC: Alert and oriented to person, place, and time. Normal reflexes, muscle tone coordination. No cranial nerve deficit noted. PSYCHIATRIC: Normal mood and affect. Normal behavior. Normal judgment and thought content. CARDIOVASCULAR: Normal heart rate noted, regular rhythm RESPIRATORY: Effort and breath sounds normal, no problems with respiration noted ABDOMEN: Soft, nondistended. Bilateral lower abdominal tenderness, no rebound or guarding PELVIC: Deferred MUSCULOSKELETAL: Normal range of motion. No edema and no tenderness. 2+ distal pulses.  Labs: Results for orders placed or performed during the hospital encounter of 03/27/16 (from the past 336 hour(s))  hCG, quantitative, pregnancy   Collection Time: 03/27/16  4:19 PM  Result Value Ref Range   hCG, Beta Chain, Quant, S 1594 (H) <5 mIU/mL  CBC   Collection Time: 03/27/16  4:19 PM  Result Value Ref Range   WBC 6.2 4.0 - 10.5 K/uL   RBC 4.27 3.87 - 5.11 MIL/uL   Hemoglobin 12.5 12.0 - 15.0 g/dL   HCT 29.5 62.1 - 30.8 %   MCV 84.3 78.0 - 100.0 fL   MCH 29.3 26.0 -  34.0 pg   MCHC 34.7 30.0 - 36.0 g/dL   RDW 08.614.1 57.811.5 - 46.915.5 %   Platelets 198 150 - 400 K/uL  Type and screen [facility]   Collection Time: 03/27/16  4:19 PM  Result Value Ref Range   ABO/RH(D) A POS    Antibody Screen PENDING    Sample Expiration 03/30/2016   Results for orders placed or performed during the hospital encounter of 03/19/16 (from the past 336 hour(s))  hCG, quantitative, pregnancy   Collection Time:  03/19/16  1:58 PM  Result Value Ref Range   hCG, Beta Chain, Quant, S 2153 (H) <5 mIU/mL  CBC WITH DIFFERENTIAL   Collection Time: 03/19/16  1:58 PM  Result Value Ref Range   WBC 4.6 4.0 - 10.5 K/uL   RBC 4.41 3.87 - 5.11 MIL/uL   Hemoglobin 13.2 12.0 - 15.0 g/dL   HCT 62.937.8 52.836.0 - 41.346.0 %   MCV 85.7 78.0 - 100.0 fL   MCH 29.9 26.0 - 34.0 pg   MCHC 34.9 30.0 - 36.0 g/dL   RDW 24.414.2 01.011.5 - 27.215.5 %   Platelets 232 150 - 400 K/uL   Neutrophils Relative % 33 %   Neutro Abs 1.5 (L) 1.7 - 7.7 K/uL   Lymphocytes Relative 50 %   Lymphs Abs 2.2 0.7 - 4.0 K/uL   Monocytes Relative 15 %   Monocytes Absolute 0.7 0.1 - 1.0 K/uL   Eosinophils Relative 2 %   Eosinophils Absolute 0.1 0.0 - 0.7 K/uL   Basophils Relative 0 %   Basophils Absolute 0.0 0.0 - 0.1 K/uL  AST   Collection Time: 03/19/16  1:58 PM  Result Value Ref Range   AST 18 15 - 41 U/L  BUN   Collection Time: 03/19/16  1:58 PM  Result Value Ref Range   BUN 7 6 - 20 mg/dL  Creatinine, serum   Collection Time: 03/19/16  1:58 PM  Result Value Ref Range   Creatinine, Ser 0.77 0.44 - 1.00 mg/dL   GFR calc non Af Amer >60 >60 mL/min   GFR calc Af Amer >60 >60 mL/min  Results for orders placed or performed during the hospital encounter of 03/17/16 (from the past 336 hour(s))  CBC   Collection Time: 03/17/16 10:51 AM  Result Value Ref Range   WBC 7.1 4.0 - 10.5 K/uL   RBC 4.52 3.87 - 5.11 MIL/uL   Hemoglobin 13.3 12.0 - 15.0 g/dL   HCT 53.638.4 64.436.0 - 03.446.0 %   MCV 85.0 78.0 - 100.0 fL   MCH 29.4 26.0 - 34.0 pg   MCHC 34.6 30.0 - 36.0 g/dL   RDW 74.214.2 59.511.5 - 63.815.5 %   Platelets 216 150 - 400 K/uL  hCG, quantitative, pregnancy   Collection Time: 03/17/16 10:51 AM  Result Value Ref Range   hCG, Beta Chain, Quant, S 1124 (H) <5 mIU/mL  Urinalysis, Routine w reflex microscopic (not at [facility])   Collection Time: 03/17/16 11:12 AM  Result Value Ref Range   Color, Urine YELLOW YELLOW   APPearance CLEAR CLEAR   Specific Gravity, Urine  1.025 1.005 - 1.030   pH 5.5 5.0 - 8.0   Glucose, UA NEGATIVE NEGATIVE mg/dL   Hgb urine dipstick MODERATE (A) NEGATIVE   Bilirubin Urine SMALL (A) NEGATIVE   Ketones, ur 15 (A) NEGATIVE mg/dL   Protein, ur NEGATIVE NEGATIVE mg/dL   Nitrite NEGATIVE NEGATIVE   Leukocytes, UA NEGATIVE NEGATIVE  Urine microscopic-add on  Collection Time: March 20, 2016 11:12 AM  Result Value Ref Range   Squamous Epithelial / LPF 6-30 (A) NONE SEEN   WBC, UA 0-5 0 - 5 WBC/hpf   RBC / HPF 0-5 0 - 5 RBC/hpf   Bacteria, UA RARE (A) NONE SEEN  Results for orders placed or performed during the hospital encounter of 03/15/16 (from the past 336 hour(s))  GC/Chlamydia probe amp   Collection Time: 03/15/16 12:00 AM  Result Value Ref Range   Chlamydia Negative    Neisseria gonorrhea Negative   hCG, quantitative, pregnancy   Collection Time: 03/15/16  9:13 AM  Result Value Ref Range   hCG, Beta Chain, Quant, S 834 (H) <5 mIU/mL  CBC with Differential   Collection Time: 03/15/16  9:13 AM  Result Value Ref Range   WBC 5.0 4.0 - 10.5 K/uL   RBC 4.59 3.87 - 5.11 MIL/uL   Hemoglobin 13.3 12.0 - 15.0 g/dL   HCT 16.1 09.6 - 04.5 %   MCV 86.3 78.0 - 100.0 fL   MCH 29.0 26.0 - 34.0 pg   MCHC 33.6 30.0 - 36.0 g/dL   RDW 40.9 81.1 - 91.4 %   Platelets 226 150 - 400 K/uL   Neutrophils Relative % 39 %   Neutro Abs 1.9 1.7 - 7.7 K/uL   Lymphocytes Relative 47 %   Lymphs Abs 2.3 0.7 - 4.0 K/uL   Monocytes Relative 12 %   Monocytes Absolute 0.6 0.1 - 1.0 K/uL   Eosinophils Relative 2 %   Eosinophils Absolute 0.1 0.0 - 0.7 K/uL   Basophils Relative 0 %   Basophils Absolute 0.0 0.0 - 0.1 K/uL  Basic metabolic panel   Collection Time: 03/15/16  9:13 AM  Result Value Ref Range   Sodium 137 135 - 145 mmol/L   Potassium 4.1 3.5 - 5.1 mmol/L   Chloride 105 101 - 111 mmol/L   CO2 24 22 - 32 mmol/L   Glucose, Bld 96 65 - 99 mg/dL   BUN 8 6 - 20 mg/dL   Creatinine, Ser 7.82 0.44 - 1.00 mg/dL   Calcium 9.3 8.9 - 95.6  mg/dL   GFR calc non Af Amer >60 >60 mL/min   GFR calc Af Amer >60 >60 mL/min   Anion gap 8 5 - 15  Type and screen   Collection Time: 03/15/16  9:20 AM  Result Value Ref Range   ABO/RH(D) A POS    Antibody Screen NEG    Sample Expiration 03/18/2016   ABO/Rh   Collection Time: 03/15/16  9:20 AM  Result Value Ref Range   ABO/RH(D) A POS   RPR   Collection Time: 03/15/16 10:28 AM  Result Value Ref Range   RPR Ser Ql Non Reactive Non Reactive  HIV antibody   Collection Time: 03/15/16 10:28 AM  Result Value Ref Range   HIV Screen 4th Generation wRfx Non Reactive Non Reactive  Wet prep, genital   Collection Time: 03/15/16 12:40 PM  Result Value Ref Range   Yeast Wet Prep HPF POC NONE SEEN NONE SEEN   Trich, Wet Prep NONE SEEN NONE SEEN   Clue Cells Wet Prep HPF POC PRESENT (A) NONE SEEN   WBC, Wet Prep HPF POC FEW (A) NONE SEEN   Sperm NONE SEEN     Imaging Studies: US Ob Comp Less 14 Wks  2016/03/20  CLINICAL DATA:  Spotting and cramping. EXAM: OBSTETRIC <14 WK [place] AND TRANSVAGINAL OB US TECHNIQUE: Both transabdominal and transvaginal  ultrasound examinations were performed for complete evaluation of the gestation as well as the maternal uterus, adnexal regions, and pelvic cul-de-sac. Transvaginal technique was performed to assess early pregnancy. COMPARISON:  None. FINDINGS: Intrauterine gestational sac: None Yolk sac:  No Embryo:  No Maternal uterus/adnexae: Right ovary: Normal Left ovary: Normal Other :Within the left adnexa there is a indeterminate mass separate from the ovary which measures 3.1 x 1.8 x 1.3 cm. On the dynamic images this appears to move independent from the adjacent ovary. Posterior fundal fibroid measures 4.3 x 4.0 x 3.5 cm. Free fluid:  Small amount of free fluid identified within the pelvis IMPRESSION: 1. No evidence for intrauterine gestation. 2. Indeterminate mass within the left adnexa, adjacent to the right ovary. Cannot rule out ectopic pregnancy. Critical  Value/emergent results were called by telephone at the time of interpretation on 03/17/2016 at 3:01 pm to Judeth Horn who verbally acknowledged these results. Electronically Signed   By: Signa Kell [name]   On: 03/17/2016 15:03   US Ob Comp Less 14 Wks  03/15/2016  CLINICAL DATA:  Vaginal bleeding/ spotting.  Early pregnancy. EXAM: OBSTETRIC <14 WK [place] AND TRANSVAGINAL OB US TECHNIQUE: Both transabdominal and transvaginal ultrasound examinations were performed for complete evaluation of the gestation as well as the maternal uterus, adnexal regions, and pelvic cul-de-sac. Transvaginal technique was performed to assess early pregnancy. COMPARISON:  CT abdomen and pelvis 03/29/2007. Pelvic ultrasound 03/28/2006. FINDINGS: Intrauterine gestational sac: None visualized. Maternal uterus/adnexae: In intramural fibroid in the right body of the uterus measures 4.6 x 4.0 x 3.5 cm. Endometrium measures 12 mm in thickness. The ovaries are unremarkable in appearance. Trace free fluid in the pelvis. IMPRESSION: 1. No intrauterine gestation or ectopic pregnancy identified. Clinical and laboratory follow-up recommended with consideration for short-term follow-up ultrasound as clinically warranted. 2. Trace pelvic free fluid. 3. 4.6 cm uterine fibroid. Electronically Signed   By: Sebastian Ache [name]   On: 03/15/2016 11:45   US Ob Transvaginal  03/27/2016  CLINICAL DATA:  Pelvic pain, persistent EXAM: TRANSVAGINAL OB ULTRASOUND TECHNIQUE: Transvaginal ultrasound was performed for complete evaluation of the gestation as well as the maternal uterus, adnexal regions, and pelvic cul-de-sac. COMPARISON:  March 19, 2016 FINDINGS: Intrauterine gestational sac: Not visualized Yolk sac:  Not visualized Embryo:  Not visualized Cardiac Activity: Not visualized Subchorionic hemorrhage:  None visualized. Maternal uterus/adnexae: In the left adnexa, there is a double decidual sac measuring 1.7 x 1.6 x 1.5 cm consistent with an ectopic  gestation. Ectopic fetal pole is not seen. There is moderate fluid surrounding this structure in the left adnexa. There is moderate free fluid in the cul-de-sac is well. Left and right ovaries appear normal. No adnexal lesion is seen on the right. Within the uterus, there is a 5.3 x 4.6 x 4.0 cm leiomyoma. IMPRESSION: There is evidence of an ectopic gestation on the left with double decidual sac seen in the left adnexa. There is surrounding fluid as well as moderate free fluid. There is a leiomyoma in the uterus measuring 5.3 x 4.6 x 4.0 cm. No intrauterine gestation evident. Critical Value/emergent results were called by telephone at the time of interpretation on 03/27/2016 at 5:30 pm to [facility], NP , who verbally acknowledged these results. Electronically Signed   By: Bretta Bang III [name]   On: 03/27/2016 17:31   US Ob Transvaginal  03/19/2016  CLINICAL DATA:  Vaginal bleeding. Quantitative beta HCG 2,153 (1,124 on 03/17/2016). Estimated gestational age [redacted] weeks 1  day per LMP. EXAM: TRANSVAGINAL OB ULTRASOUND TECHNIQUE: Transvaginal ultrasound was performed for complete evaluation of the gestation as well as the maternal uterus, adnexal regions, and pelvic cul-de-sac. COMPARISON:  03/15/2016 and 03/17/2016 FINDINGS: Intrauterine gestational sac: Not visualized. Yolk sac:  Not visualized. Embryo:  Not visualized. Cardiac Activity: Not visualized. Heart Rate: Not visualized. Maternal uterus/adnexae: Uterus is normal size, shape and position again demonstrates a right-sided posterior fundal fibroid measuring 4.6 cm unchanged. Endometrium measures 9 mm. Right ovary is normal in size, shape and position with normal vascularity. Left ovary is normal in size, shape and position with normal vascularity. There is a mass within the left adnexa which appears separate from the left ovary as seen previously and measures 1.7 x 1.7 x 2.6 cm with mild vascularity. This is not significantly changed in size, but has a  central hypoechoic focus possibly a gestational sac. No yolk sac or embryo identified. This likely represents a left tubal ectopic pregnancy. Mild amount of free pelvic fluid. IMPRESSION: No evidence of IUP. Mass in the left adnexa separate from the left ovary measuring 1.7 x 1.7 x 2.6 cm with mild vascularity without significant change from the prior exam. Given patient's history, this likely represents a left-sided tubal ectopic pregnancy. Posterior right fundal fibroid measuring 4.6 cm. Critical Value/emergent results were called by telephone at the time of interpretation on 03/19/2016 at 5:07 pm to Dr. Emelda Fear, who verbally acknowledged these results. Electronically Signed   By: Elberta Fortis [name]   On: 03/19/2016 17:07   US Ob Transvaginal  03/17/2016  CLINICAL DATA:  Spotting and cramping. EXAM: OBSTETRIC <14 WK [place] AND TRANSVAGINAL OB US TECHNIQUE: Both transabdominal and transvaginal ultrasound examinations were performed for complete evaluation of the gestation as well as the maternal uterus, adnexal regions, and pelvic cul-de-sac. Transvaginal technique was performed to assess early pregnancy. COMPARISON:  None. FINDINGS: Intrauterine gestational sac: None Yolk sac:  No Embryo:  No Maternal uterus/adnexae: Right ovary: Normal Left ovary: Normal Other :Within the left adnexa there is a indeterminate mass separate from the ovary which measures 3.1 x 1.8 x 1.3 cm. On the dynamic images this appears to move independent from the adjacent ovary. Posterior fundal fibroid measures 4.3 x 4.0 x 3.5 cm. Free fluid:  Small amount of free fluid identified within the pelvis IMPRESSION: 1. No evidence for intrauterine gestation. 2. Indeterminate mass within the left adnexa, adjacent to the right ovary. Cannot rule out ectopic pregnancy. Critical Value/emergent results were called by telephone at the time of interpretation on 03/17/2016 at 3:01 pm to Judeth Horn who verbally acknowledged these results. Electronically  Signed   By: Signa Kell [name]   On: 03/17/2016 15:03   US Ob Transvaginal  03/15/2016  CLINICAL DATA:  Vaginal bleeding/ spotting.  Early pregnancy. EXAM: OBSTETRIC <14 WK [place] AND TRANSVAGINAL OB US TECHNIQUE: Both transabdominal and transvaginal ultrasound examinations were performed for complete evaluation of the gestation as well as the maternal uterus, adnexal regions, and pelvic cul-de-sac. Transvaginal technique was performed to assess early pregnancy. COMPARISON:  CT abdomen and pelvis 03/29/2007. Pelvic ultrasound 03/28/2006. FINDINGS: Intrauterine gestational sac: None visualized. Maternal uterus/adnexae: In intramural fibroid in the right body of the uterus measures 4.6 x 4.0 x 3.5 cm. Endometrium measures 12 mm in thickness. The ovaries are unremarkable in appearance. Trace free fluid in the pelvis. IMPRESSION: 1. No intrauterine gestation or ectopic pregnancy identified. Clinical and laboratory follow-up recommended with consideration for short-term follow-up ultrasound as clinically warranted. 2. Trace  pelvic free fluid. 3. 4.6 cm uterine fibroid. Electronically Signed   By: Sebastian Ache [name]   On: 03/15/2016 11:45    Assessment: Patient Active Problem List   Diagnosis Date Noted  . Ruptured left tubal ectopic pregnancy causing hemoperitoneum 03/27/2016    Plan: Patient will undergo surgical management with emergent laparoscopic left salpingectomy and removal of ectopic pregnancy.  The risks of surgery were discussed in detail with the patient including but not limited to:  bleeding which may require transfusion or reoperation, infection, injury to bowel or other surrounding organs, need for additional procedures including laparotomy, surgical site problems and other postoperative/anesthesia complications were explained to patient and written informed consent was obtained.  Routine postoperative instructions will be reviewed with the patient and her family in detail after surgery.  Patient  has been NPO since 1400 and she will remain NPO for procedure. Anesthesia and OR aware.  To OR when ready.    Jaynie Collins, [name] 03/27/2016 6:21 PM

## 2016-03-27 NOTE — Transfer of Care (Signed)
Immediate Anesthesia Transfer of Care Note  Patient: [name]  Procedure(s) Performed: Procedure(s): DIAGNOSTIC LAPAROSCOPY WITH REMOVAL OF ECTOPIC PREGNANCY,  Left Salpingectomy (N/A)  Patient Location: PACU  Anesthesia Type:General  Level of Consciousness: awake  Airway & Oxygen Therapy: Patient Spontanous Breathing and Patient connected to nasal cannula oxygen  Post-op Assessment: Report given to RN and Post -op Vital signs reviewed and stable  Post vital signs: stable  Last Vitals:  Filed Vitals:   03/27/16 1612  BP: 124/63  Pulse: 84  Temp: 36.7 C  Resp: 18    Last Pain:  Filed Vitals:   03/27/16 1615  PainSc: 6       Patients Stated Pain Goal: 0 (03/27/16 1614)  Complications: No apparent anesthesia complications

## 2016-03-27 NOTE — Discharge Instructions (Signed)

## 2016-03-27 NOTE — Op Note (Signed)
[name] PROCEDURE DATE: 03/27/2016  PREOPERATIVE DIAGNOSIS: Ruptured ectopic pregnancy POSTOPERATIVE DIAGNOSIS: Ruptured left fallopian tube ectopic pregnancy PROCEDURE: Laparoscopic left salpingectomy and removal of ectopic pregnancy SURGEON:  Dr. Jaynie CollinsUgonna Zanaiya Calabria ANESTHESIOLOGY TEAM:  Anesthesiologist: Heather RobertsJames Singer, MD  CRNA: Renford DillsJanet L Mullins, CRNA  INDICATIONS: 30 y.o. G1P0 at 5644w2d here with the preoperative diagnoses as listed above.  Please refer to preoperative notes for more details. Patient was counseled regarding need for laparoscopic salpingectomy. Risks of surgery including bleeding which may require transfusion or reoperation, infection, injury to bowel or other surrounding organs, need for additional procedures including laparotomy and other postoperative/anesthesia complications were explained to patient.  Written informed consent was obtained.  FINDINGS:  Small amount of hemoperitoneum estimated to be about 75 ml of blood and clots.  Dilated left fallopian tube containing ectopic gestation. Small normal appearing uterus, normal right fallopian tube, right ovary and left ovary.  ANESTHESIA: General INTRAVENOUS FLUIDS: 1500 ml ESTIMATED BLOOD LOSS: 75 ml URINE OUTPUT: 300 ml SPECIMENS: Left fallopian tube containing ectopic gestation COMPLICATIONS: None immediate  PROCEDURE IN DETAIL:  The patient was taken to the operating room where general anesthesia was administered and was found to be adequate.  She was placed in the dorsal lithotomy position, and was prepped and draped in a sterile manner.  A Foley catheter was inserted into her bladder and attached to constant drainage and a uterine manipulator was then advanced into the uterus .    After an adequate timeout was performed, attention was turned to the abdomen where an umbilical incision was made with the scalpel.  The Optiview 11-mm trocar and sleeve were then advanced without difficulty with the laparoscope under direct  visualization into the abdomen.  The abdomen was then insufflated with carbon dioxide gas and adequate pneumoperitoneum was obtained.  A survey of the patient's pelvis and abdomen revealed the findings above.  Two 5-mm left lower quadrant ports were then placed under direct visualization.  The Nezhat suction irrigator was then used to suction the hemoperitoneum and irrigate the pelvis.  Attention was then turned to the left fallopian tube which was grasped and ligated from the underlying mesosalpinx and uterine attachment using the Harmonic instrument.  Good hemostasis was noted.  The specimen was placed in an EndoCatch bag and removed from the abdomen intact.  The abdomen was desufflated, and all instruments were removed.  The fascial incision of the 10-mm site was reapproximated with a 0 Vicryl figure-of-eight stitch; and all skin incisions were closed withDermabond. The patient tolerated the procedure well.  All instruments, needles, and sponge counts were correct x 2. The patient was taken to the recovery room in stable condition.   The patient will be discharged to home as per PACU criteria.  Routine postoperative instructions given.  She was prescribed Percocet, Ibuprofen and Colace.  She will follow up in the clinic in about 2-3 weeks for postoperative evaluation.   Jaynie CollinsUGONNA  Barbara Freshour, MD, FACOG Attending Obstetrician & Gynecologist Faculty Practice, [facility] [place]

## 2016-03-30 ENCOUNTER — Encounter (HOSPITAL_COMMUNITY): Payer: Self-pay | Admitting: Obstetrics & Gynecology

## 2016-04-30 ENCOUNTER — Encounter: Payer: Self-pay | Admitting: Obstetrics & Gynecology

## 2016-04-30 ENCOUNTER — Ambulatory Visit (INDEPENDENT_AMBULATORY_CARE_PROVIDER_SITE_OTHER): Payer: Self-pay | Admitting: Clinical

## 2016-04-30 ENCOUNTER — Ambulatory Visit (INDEPENDENT_AMBULATORY_CARE_PROVIDER_SITE_OTHER): Payer: BLUE CROSS/BLUE SHIELD | Admitting: Obstetrics & Gynecology

## 2016-04-30 VITALS — BP 130/60 | HR 79 | Temp 98.6°F | Ht 65.0 in | Wt 207.3 lb

## 2016-04-30 DIAGNOSIS — F4321 Adjustment disorder with depressed mood: Secondary | ICD-10-CM

## 2016-04-30 DIAGNOSIS — F32A Depression, unspecified: Secondary | ICD-10-CM

## 2016-04-30 DIAGNOSIS — Z789 Other specified health status: Secondary | ICD-10-CM

## 2016-04-30 DIAGNOSIS — F329 Major depressive disorder, single episode, unspecified: Secondary | ICD-10-CM

## 2016-04-30 DIAGNOSIS — Z09 Encounter for follow-up examination after completed treatment for conditions other than malignant neoplasm: Secondary | ICD-10-CM

## 2016-04-30 DIAGNOSIS — Z7289 Other problems related to lifestyle: Secondary | ICD-10-CM

## 2016-04-30 NOTE — Progress Notes (Signed)
   ASSESSMENT: Pt currently experiencing Grief. Pt [facility], as needed. Pt would benefit from brief therapeutic interventions regarding coping with grief. Pt may benefit from group grief support. Stage of Change: contemplative  PLAN: 1. F/U with behavioral health clinician in one month, or as needed 2. Psychiatric Medications: none 3. Behavioral recommendations:   -Take Benadryl before bedtime for sleep, as directed, per Dr. Macon LargeAnyanwu -Consider note beside bed to remind self to get out of bed -Consider [facility] support group for grief -Call sister/twin daily (sister will remind her to take a brief walk and listen to favorite song daily)   SUBJECTIVE: Pt. referred by Dr Macon LargeAnyanwu  for grief reaction Pt. reports the following symptoms/concerns: Pt primary concern today is that she is "pushing away" the grief she is feeling, after the loss of pregnancy, due to ectopic pregnancy, after being unable to become pregnant for 9 years. Pt has full support of sister, along with other supportive family and friends. Pt says she is still processing the grief, and open to any advice about how to move on. No SI or HI.  Duration of problem: Over one month Severity: severe   OBJECTIVE: Orientation & Cognition: Oriented x3. Thought processes normal and appropriate to situation. Mood: teary (appropriate for grief) Affect: appropriate Appearance: appropriate Risk of harm to self or others:  Substance use: alcohol Assessments administered: none  Diagnosis: Grief  CPT Code: F43.21  -------------------------------------------- Other(s) present in the room: none  Time spent with patient in exam room: 30 minutes, 1:30-2:00pm   [facility] 2/9 04/30/2016 03/13/2016  Decreased Interest 3 0  Down, Depressed, Hopeless 3 0  PHQ - 2 Score 6 0  Altered sleeping 3 -  Tired, decreased energy 3 -  Change in appetite 3 -  Feeling bad or failure about yourself  3 -  Trouble  concentrating 3 -  Suicidal thoughts 0 -  PHQ-9 Score 21 -  Difficult doing work/chores Very difficult -    GAD 7 : Generalized Anxiety Score 04/30/2016  Nervous, Anxious, on Edge 3  Control/stop worrying 3  Worry too much - different things 3  Trouble relaxing 3  Restless 2  Easily annoyed or irritable 3  Afraid - awful might happen 3  Total GAD 7 Score 20

## 2016-04-30 NOTE — Patient Instructions (Signed)
Return to clinic for any scheduled appointments or for any gynecologic concerns as needed.   

## 2016-04-30 NOTE — Progress Notes (Signed)
     Subjective:     [name] is a 30 y.o.G1P0010 female who presents to the clinic status post laparoscopic left salpingectomy and removal of ectopic pregnancy on 03/27/16 for ruptured left tubal ectopic pregnancy.  Eating a regular diet with difficulty. Bowel movements are normal. The patient is not having any pain.. Patient admits to smokiing tobacco and THC; also drinking one bottle of wine a day "to deal with a lot".  Feels very depressed.   The following portions of the patient's history were reviewed and updated as appropriate: allergies, current medications, past family history, past medical history, past social history, past surgical history and problem list.  Review of Systems Pertinent items are noted in HPI.    Objective:    BP 130/60 mmHg  Pulse 79  Temp(Src) 98.6 F (37 C)  Ht 5\' 5"  (1.651 m)  Wt 207 lb 4.8 oz (94.031 kg)  BMI 34.50 kg/m2  LMP 02/12/2016  Breastfeeding? Unknown General:  alert and no distress  Abdomen: soft, bowel sounds active, non-tender  Incision:   healing well, no drainage, no erythema, no hernia, no seroma, no swelling, no dehiscence, incision well approximated    Surgical Pathology Fallopian tube, ectopic pregnancy, left - INTRALUMINAL AND INTRAMURAL IMMATURE CHORIONIC VILLI WITH FOCI OF TUBAL DECIDUALIZATION, CONSISTENT WITH ECTOPIC PREGNANCY. - NO ABNORMAL TROPHOBLASTIC ACTIVITY IDENTIFIED. - NO ENDOMETRIOSIS OR MALIGNANCY IDENTIFIED.  Assessment:    Doing well postoperatively. Operative findings again reviewed. Pathology report discussed.    Plan:   1. Continue any current medications. 2. OCPs prescribed for contraception 3. Activity restrictions: none 4. Anticipated return to work: not applicable. 5. Patient will meet with our counselor today given her depression and substance use issues. 6. Follow up in 2 months for OCP/BP check.  Jaynie CollinsUGONNA  Maitland Lesiak, MD, FACOG Attending Obstetrician & Gynecologist, [facility]

## 2016-10-24 ENCOUNTER — Encounter (HOSPITAL_COMMUNITY): Payer: Self-pay | Admitting: *Deleted

## 2016-10-24 ENCOUNTER — Emergency Department (HOSPITAL_COMMUNITY)
Admission: EM | Admit: 2016-10-24 | Discharge: 2016-10-24 | Disposition: A | Discharge status: Home / Self Care | Payer: BLUE CROSS/BLUE SHIELD | Attending: Emergency Medicine | Admitting: Emergency Medicine

## 2016-10-24 DIAGNOSIS — F17210 Nicotine dependence, cigarettes, uncomplicated: Secondary | ICD-10-CM | POA: Insufficient documentation

## 2016-10-24 DIAGNOSIS — R1013 Epigastric pain: Secondary | ICD-10-CM | POA: Insufficient documentation

## 2016-10-24 DIAGNOSIS — Z5321 Procedure and treatment not carried out due to patient leaving prior to being seen by health care provider: Secondary | ICD-10-CM | POA: Insufficient documentation

## 2016-10-24 LAB — COMPREHENSIVE METABOLIC PANEL
ALBUMIN: 4.2 g/dL (ref 3.5–5.0)
ALT: 19 U/L (ref 14–54)
AST: 22 U/L (ref 15–41)
Alkaline Phosphatase: 40 U/L (ref 38–126)
Anion gap: 9 (ref 5–15)
BUN: 6 mg/dL (ref 6–20)
CHLORIDE: 105 mmol/L (ref 101–111)
CO2: 23 mmol/L (ref 22–32)
CREATININE: 0.8 mg/dL (ref 0.44–1.00)
Calcium: 9.2 mg/dL (ref 8.9–10.3)
GFR calc Af Amer: >60 mL/min (ref >60)
Glucose, Bld: 124 mg/dL — ABNORMAL HIGH (ref 65–99)
POTASSIUM: 4.1 mmol/L (ref 3.5–5.1)
SODIUM: 137 mmol/L (ref 135–145)
Total Bilirubin: 0.5 mg/dL (ref 0.3–1.2)
Total Protein: 7.2 g/dL (ref 6.5–8.1)

## 2016-10-24 LAB — CBC
HEMATOCRIT: 41.3 % (ref 36.0–46.0)
Hemoglobin: 13.7 g/dL (ref 12.0–15.0)
MCH: 29.1 pg (ref 26.0–34.0)
MCHC: 33.2 g/dL (ref 30.0–36.0)
MCV: 87.9 fL (ref 78.0–100.0)
PLATELETS: 174 10*3/uL (ref 150–400)
RBC: 4.7 MIL/uL (ref 3.87–5.11)
RDW: 15.2 % (ref 11.5–15.5)
WBC: 6.6 10*3/uL (ref 4.0–10.5)

## 2016-10-24 LAB — LIPASE, BLOOD: LIPASE: 20 U/L (ref 11–51)

## 2016-10-24 NOTE — ED Triage Notes (Signed)
Pt c/o n/v HA onset x 1 wk, with 6-8 vomiting episodes in the last 24 hrs, pt denies diarrhea, pt c/o epigastric pain & RLQ abd pain, pt reports hx of ectopic pregnancy May 2017 with fallopian tube removal, A&O x4

## 2016-10-25 ENCOUNTER — Emergency Department (HOSPITAL_COMMUNITY)
Admission: EM | Admit: 2016-10-25 | Discharge: 2016-10-25 | Disposition: A | Discharge status: Home / Self Care | Payer: BLUE CROSS/BLUE SHIELD | Attending: Emergency Medicine | Admitting: Emergency Medicine

## 2016-10-25 DIAGNOSIS — R112 Nausea with vomiting, unspecified: Secondary | ICD-10-CM

## 2016-10-25 DIAGNOSIS — F17210 Nicotine dependence, cigarettes, uncomplicated: Secondary | ICD-10-CM | POA: Insufficient documentation

## 2016-10-25 DIAGNOSIS — R109 Unspecified abdominal pain: Secondary | ICD-10-CM

## 2016-10-25 DIAGNOSIS — K2970 Gastritis, unspecified, without bleeding: Secondary | ICD-10-CM | POA: Insufficient documentation

## 2016-10-25 DIAGNOSIS — G43109 Migraine with aura, not intractable, without status migrainosus: Secondary | ICD-10-CM | POA: Insufficient documentation

## 2016-10-25 DIAGNOSIS — M62838 Other muscle spasm: Secondary | ICD-10-CM | POA: Insufficient documentation

## 2016-10-25 DIAGNOSIS — Z79899 Other long term (current) drug therapy: Secondary | ICD-10-CM | POA: Insufficient documentation

## 2016-10-25 LAB — URINALYSIS, ROUTINE W REFLEX MICROSCOPIC
BILIRUBIN URINE: NEGATIVE
Glucose, UA: NEGATIVE mg/dL
Ketones, ur: NEGATIVE mg/dL
LEUKOCYTES UA: NEGATIVE
NITRITE: NEGATIVE
PH: 7 (ref 5.0–8.0)
Protein, ur: NEGATIVE mg/dL
Specific Gravity, Urine: 1.009 (ref 1.005–1.030)

## 2016-10-25 LAB — POC URINE PREG, ED: Preg Test, Ur: NEGATIVE

## 2016-10-25 MED ORDER — SODIUM CHLORIDE 0.9 % IV BOLUS (SEPSIS)
1000.0000 mL | Freq: Once | INTRAVENOUS | Status: AC
  Administered 2016-10-25: 1000 mL via INTRAVENOUS

## 2016-10-25 MED ORDER — GI COCKTAIL ~~LOC~~
30.0000 mL | Freq: Once | ORAL | Status: AC
  Administered 2016-10-25: 30 mL via ORAL
  Filled 2016-10-25: qty 30

## 2016-10-25 MED ORDER — METOCLOPRAMIDE HCL 10 MG PO TABS: 10.0000 mg | ORAL_TABLET | Freq: Four times a day (QID) | ORAL | 0 refills | Status: DC | PRN

## 2016-10-25 MED ORDER — DEXAMETHASONE SODIUM PHOSPHATE 10 MG/ML IJ SOLN
10.0000 mg | Freq: Once | INTRAMUSCULAR | Status: AC
  Administered 2016-10-25: 10 mg via INTRAVENOUS
  Filled 2016-10-25: qty 1

## 2016-10-25 MED ORDER — ONDANSETRON 4 MG PO TBDP
4.0000 mg | ORAL_TABLET | Freq: Once | ORAL | Status: AC | PRN
  Administered 2016-10-25: 4 mg via ORAL
  Filled 2016-10-25: qty 1

## 2016-10-25 MED ORDER — METOCLOPRAMIDE HCL 5 MG/ML IJ SOLN
10.0000 mg | Freq: Once | INTRAMUSCULAR | Status: AC
  Administered 2016-10-25: 10 mg via INTRAVENOUS
  Filled 2016-10-25: qty 2

## 2016-10-25 MED ORDER — CYCLOBENZAPRINE HCL 10 MG PO TABS
5.0000 mg | ORAL_TABLET | Freq: Once | ORAL | Status: AC
  Administered 2016-10-25: 5 mg via ORAL
  Filled 2016-10-25: qty 1

## 2016-10-25 MED ORDER — RANITIDINE HCL 150 MG PO TABS: 150.0000 mg | ORAL_TABLET | Freq: Two times a day (BID) | ORAL | 0 refills | Status: DC

## 2016-10-25 MED ORDER — CYCLOBENZAPRINE HCL 10 MG PO TABS: 10.0000 mg | ORAL_TABLET | Freq: Three times a day (TID) | ORAL | 0 refills | Status: DC | PRN

## 2016-10-25 MED ORDER — DIPHENHYDRAMINE HCL 50 MG/ML IJ SOLN
25.0000 mg | Freq: Once | INTRAMUSCULAR | Status: AC
  Administered 2016-10-25: 25 mg via INTRAVENOUS
  Filled 2016-10-25: qty 1

## 2016-10-25 MED ORDER — PROMETHAZINE HCL 25 MG/ML IJ SOLN
25.0000 mg | Freq: Once | INTRAMUSCULAR | Status: AC
  Administered 2016-10-25: 25 mg via INTRAVENOUS
  Filled 2016-10-25: qty 1

## 2016-10-25 MED ORDER — KETOROLAC TROMETHAMINE 30 MG/ML IJ SOLN
30.0000 mg | Freq: Once | INTRAMUSCULAR | Status: AC
  Administered 2016-10-25: 30 mg via INTRAVENOUS
  Filled 2016-10-25: qty 1

## 2016-10-25 NOTE — ED Provider Notes (Signed)
[facility]-EMERGENCY DEPT Provider Note   CSN: [id number] Arrival date & time: 10/25/16  0810     History   Chief Complaint No chief complaint on file.   HPI ARACELLY TENCZA is a 31 y.o. 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 Macon Large, who presents to the ED with several complaints, but primarily complaining of a constant headache 2 weeks with preceding aura of scotoma especially in R eye (described as squiggly blurry lines in vision) that completely resolve after headache starts, as well as 1 week of daily morning nausea and vomiting that resolves later in the day, intermittent RLQ abd pain related to vomiting, lightheadedness with vomiting, and rhinorrhea/sinus congestion. She states that the most frustrating symptom is the headache which is what prompted her to come to the ER. She describes the headache as gradual onset 8/10 constant throbbing right frontal and parietal headache radiating to the right lateral neck, worse with lights and loud noises, and improved after taking Goody's powders, Tylenol, ibuprofen, sumatriptan and rizatriptan given to her by her sister. She states that once the medications wear off the headache returns, and this has been ongoing for 2 weeks. Denies that this is the worst headache of her life, although states that it's slightly different than her migraines from when she was a child given that they are in a different location and a different quality of pain, but not much worse than her prior migraines. The preceding aura and scotoma completely resolved after the headache starts, and she denies any current vision changes. +FHx of migraines (sister).  Over the last 1 week, she endorses nausea that occurs every morning but resolves by around noon, with 4-5 episodes of nonbloody nonbilious emesis in the last 24 hours. She did not try anything prior  to arrival, but was given Zofran here which helped with her nausea. She endorses associated lightheadedness with vomiting, as well as mild sharp intermittent nonradiating RLQ abd pain that worsens with with vomiting; she's not as concerned about this pain/symptom as she is about her headache. Additionally she reports one week of sinus congestion and rhinorrhea, but states this came on after the headache.  Chart review reveals she came in to the ER yesterday morning (10/24/16) for "c/o n/v HA x1wk with 6-8 episodes of emesis in 24hrs, denied diarrhea, c/o epigastric and RLQ abd pain" per nursing notes; lipase, CMP, and CBC were drawn and were WNL; pt left without being seen. She returned this morning, almost exactly 24hrs after her first visit yesterday.   She denies fevers, chills, neck stiffness, loss of vision/diplopia, floaters, current vision changes, sore throat, ear pain/drainage, CP, SOB, hematemesis, melena, hematochezia, obstipation, diarrhea, constipation, hematuria, dysuria, vaginal bleeding or discharge, myalgias, arthralgias, numbness, tingling, focal weakness, head injury recently, or any other complaints at this time. Denies recent travel, sick contacts, or suspicious food intake. Endorses regular wine consumption, ~3-4 glasses/day but hasn't had any in ~3wks. Endorses fairly regular NSAID use recently. LMP 09/18/16-- states her menses have been irregular since her ectopic removal surgery in June 2017; sexually active with 1 female partner (her husband), unprotected.    The history is provided by the patient and medical records. No language interpreter was used.  Headache   This is a recurrent problem. The current episode started more than 1 week ago. The problem occurs constantly. The problem has not  changed since onset.The headache is associated with an unknown factor. The pain is located in the right unilateral region. The quality of the pain is described as throbbing. The pain is at a  severity of 8/10. The pain is moderate. The pain radiates to the right neck. Associated symptoms include nausea and vomiting. Pertinent negatives include no fever, no syncope and no shortness of breath. She has tried triptan therapy, NSAIDs and acetaminophen for the symptoms. The treatment provided moderate relief.    Past Medical History:  Diagnosis Date  . HPV (human papilloma virus) infection     There are no active problems to display for this patient.   Past Surgical History:  Procedure Laterality Date  . DIAGNOSTIC LAPAROSCOPY WITH REMOVAL OF ECTOPIC PREGNANCY N/A 03/27/2016   Procedure: Diagnostic  Laparoscopy,  Left Salpingectomy with Removal Ectopic Pregnancy;  Surgeon: Tereso Newcomer, MD;  Location: [facility];  Service: Gynecology;  Laterality: N/A;  . WISDOM TOOTH EXTRACTION      OB History    Gravida Para Term Preterm AB Living   1 0 0 0 1 0   SAB TAB Ectopic Multiple Live Births   0 0 1 0         Home Medications    Prior to Admission medications   Medication Sig Start Date End Date Taking? Authorizing Provider  ibuprofen (ADVIL,MOTRIN) 600 MG tablet Take 1 tablet (600 mg total) by mouth every 6 (six) hours as needed. 03/27/16   Tereso Newcomer, MD  oxyCODONE-acetaminophen (PERCOCET/ROXICET) 5-325 MG tablet Take 1-2 tablets by mouth every 6 (six) hours as needed. Patient not taking: Reported on 04/30/2016 03/27/16   Tereso Newcomer, MD    Family History No family history on file.  Social History Social History  Substance Use Topics  . Smoking status: Current Every Day Smoker    Packs/day: 0.25    Types: Cigarettes    Last attempt to quit: 03/13/2016  . Smokeless tobacco: Never Used  . Alcohol use 2.4 oz/week    4 Cans of beer per week     Comment: occ when not pregnant     Allergies   Patient has no known allergies.   Review of Systems Review of Systems  Constitutional: Negative for chills and fever.  HENT: Positive for rhinorrhea and sinus  pressure. Negative for ear discharge, ear pain and sore throat.   Eyes: Positive for photophobia. Negative for visual disturbance (scotoma prior to migraine, but resolves when migraine starts; none currently ongoing).  Respiratory: Negative for shortness of breath.   Cardiovascular: Negative for chest pain and syncope.  Gastrointestinal: Positive for abdominal pain, nausea and vomiting. Negative for blood in stool, constipation and diarrhea.  Genitourinary: Negative for dysuria, hematuria, vaginal bleeding and vaginal discharge.  Musculoskeletal: Positive for neck pain (radiating from head). Negative for arthralgias, myalgias and neck stiffness.  Skin: Negative for color change.  Allergic/Immunologic: Negative for immunocompromised state.  Neurological: Positive for light-headedness (intermittent with vomiting) and headaches. Negative for syncope, weakness and numbness.  Psychiatric/Behavioral: Negative for confusion.   10 Systems reviewed and are negative for acute change except as noted in the HPI.   Physical Exam Updated Vital Signs BP 124/66   Pulse 80   Temp 97.9 F (36.6 C)   Resp 16   LMP 10/16/2016 (Approximate)   SpO2 99%   Physical Exam  Constitutional: She is oriented to person, place, and time. Vital signs are normal. She appears well-developed and well-nourished.  Non-toxic appearance.  No distress.  Afebrile, nontoxic, NAD  HENT:  Head: Normocephalic and atraumatic.  Nose: Mucosal edema and rhinorrhea present. Right sinus exhibits frontal sinus tenderness. Right sinus exhibits no maxillary sinus tenderness. Left sinus exhibits no maxillary sinus tenderness and no frontal sinus tenderness.  Mouth/Throat: Uvula is midline, oropharynx is clear and moist and mucous membranes are normal. No trismus in the jaw. No uvula swelling.  Nose with mild mucosal edema and clear rhinorrhea, mild R frontal sinus TTP but otherwise no other focal tenderness in rest of sinuses. Oropharynx  clear and moist, without uvular swelling or deviation, no trismus or drooling, no tonsillar swelling or erythema, no exudates.    Eyes: Conjunctivae and EOM are normal. Pupils are equal, round, and reactive to light. Right eye exhibits no discharge. Left eye exhibits no discharge.  PERRL, EOMI, no nystagmus, no visual field deficits   Neck: Normal range of motion. Neck supple. Muscular tenderness present. No spinous process tenderness present. No neck rigidity. Normal range of motion present.    FROM intact without spinous process TTP, no bony stepoffs or deformities, with mild R sided paraspinous muscle TTP and muscle spasms. No rigidity or meningeal signs. No bruising or swelling.   Cardiovascular: Normal rate, regular rhythm, normal heart sounds and intact distal pulses.  Exam reveals no gallop and no friction rub.   No murmur heard. Pulmonary/Chest: Effort normal and breath sounds normal. No respiratory distress. She has no decreased breath sounds. She has no wheezes. She has no rhonchi. She has no rales.  Abdominal: Soft. Normal appearance and bowel sounds are normal. She exhibits no distension. There is tenderness in the epigastric area. There is no rigidity, no rebound, no guarding, no CVA tenderness, no tenderness at McBurney's point and negative Murphy's sign.    Soft, nondistended, +BS throughout, with very mild epigstric TTP but no other areas of focal TTP in remainder of abdomen, no lower abdominal TTP, no r/g/r, neg murphy's, neg mcburney's, no CVA TTP   Musculoskeletal: Normal range of motion.  MAE x4 Strength and sensation grossly intact in all extremities Distal pulses intact Gait steady  Neurological: She is alert and oriented to person, place, and time. She has normal strength. No cranial nerve deficit or sensory deficit. Coordination and gait normal. GCS eye subscore is 4. GCS verbal subscore is 5. GCS motor subscore is 6.  CN 2-12 grossly intact A&O x4 GCS 15 Sensation and  strength intact Gait nonataxic including with tandem walking Coordination with finger-to-nose WNL Neg pronator drift   Skin: Skin is warm, dry and intact. No rash noted.  Psychiatric: She has a normal mood and affect.  Nursing note and vitals reviewed.    ED Treatments / Results  Labs (all labs ordered are listed, but only abnormal results are displayed) Labs Reviewed  URINALYSIS, ROUTINE W REFLEX MICROSCOPIC - Abnormal; Notable for the following:       Result Value   Hgb urine dipstick SMALL (*)    Bacteria, UA RARE (*)    Squamous Epithelial / LPF 0-5 (*)    All other components within normal limits  POC URINE PREG, ED   Results for orders placed or performed during the hospital encounter of 10/24/16  Lipase, blood  Result Value Ref Range   Lipase 20 11 - 51 U/L  Comprehensive metabolic panel  Result Value Ref Range   Sodium 137 135 - 145 mmol/L   Potassium 4.1 3.5 - 5.1 mmol/L   Chloride 105 101 - 111  mmol/L   CO2 23 22 - 32 mmol/L   Glucose, Bld 124 (H) 65 - 99 mg/dL   BUN 6 6 - 20 mg/dL   Creatinine, Ser 1.61 0.44 - 1.00 mg/dL   Calcium 9.2 8.9 - 09.6 mg/dL   Total Protein 7.2 6.5 - 8.1 g/dL   Albumin 4.2 3.5 - 5.0 g/dL   AST 22 15 - 41 U/L   ALT 19 14 - 54 U/L   Alkaline Phosphatase 40 38 - 126 U/L   Total Bilirubin 0.5 0.3 - 1.2 mg/dL   GFR calc non Af Amer >60 >60 mL/min   GFR calc Af Amer >60 >60 mL/min   Anion gap 9 5 - 15  CBC  Result Value Ref Range   WBC 6.6 4.0 - 10.5 K/uL   RBC 4.70 3.87 - 5.11 MIL/uL   Hemoglobin 13.7 12.0 - 15.0 g/dL   HCT 04.5 40.9 - 81.1 %   MCV 87.9 78.0 - 100.0 fL   MCH 29.1 26.0 - 34.0 pg   MCHC 33.2 30.0 - 36.0 g/dL   RDW 91.4 78.2 - 95.6 %   Platelets 174 150 - 400 K/uL    EKG  EKG Interpretation None       Radiology No results found.  Procedures Procedures (including critical care time)  Medications Ordered in ED Medications  ondansetron (ZOFRAN-ODT) disintegrating tablet 4 mg (4 mg Oral Given 10/25/16  0828)  metoCLOPramide (REGLAN) injection 10 mg (10 mg Intravenous Given 10/25/16 1018)    And  diphenhydrAMINE (BENADRYL) injection 25 mg (25 mg Intravenous Given 10/25/16 1018)    And  sodium chloride 0.9 % bolus 1,000 mL (0 mLs Intravenous Stopped 10/25/16 1142)    And  ketorolac (TORADOL) 30 MG/ML injection 30 mg (30 mg Intravenous Given 10/25/16 1018)  cyclobenzaprine (FLEXERIL) tablet 5 mg (5 mg Oral Given 10/25/16 1022)  dexamethasone (DECADRON) injection 10 mg (10 mg Intravenous Given 10/25/16 1018)  promethazine (PHENERGAN) injection 25 mg (25 mg Intravenous Given 10/25/16 1223)  gi cocktail (Maalox,Lidocaine,Donnatal) (30 mLs Oral Given 10/25/16 1223)     Initial Impression / Assessment and Plan / ED Course  I have reviewed the triage vital signs and the nursing notes.  Pertinent labs & imaging results that were available during my care of the patient were reviewed by me and considered in my medical decision making (see chart for details).  Clinical Course     31 y.o. female here with 2wks of HA with preceding aura and scotoma, and 1wk of daily n/v/RLQ abd pain and lightheadedness with vomiting, as well as sinus congestion/rhinorrhea. Most bothersome symptom is the HA. States it feels a little different in location and quality of pain from her prior migraines, but denies this is the worst HA of her life. On exam, no focal neuro deficits, mild R paracervical muscle TTP and spasms so could be tension related HA; mild nasal congestion and clear rhinorrhea so could be URI/congestion related but this symptom started after the HA; could just be migraines especially since she had them in childhood; doubt other emergent secondary cause of headache and doubt need for head imaging or LP/etc. Abd exam with mild epigastric TTP (likely from gastritis related NSAID use which is potentially causing/contributing to the n/v) but no tenderness in RLQ or elsewhere in abdomen. Was here last night 10/24/16 and  LWBS but had labs including CBC, CMP, and lipase which were completely unremarkable. Doubt need for further blood tests or imaging at this  time, doubt need for pelvic exam; will get Upreg and U/A, and give migraine cocktail+flexeril+decadron. Zofran already given which helped with nausea. Will reassess shortly  11:53 AM U/A with few squamous and neg nitrite/leuk, rare bacteria; likely mildly contaminated, doesn't seem like UTI. Upreg neg. Pt feeling better as far as the headache is concerned after migraine cocktail/flexeril/decadron treatment. However she still has some mild epigastric abd pain and nausea (highly likely this is in part due to the gastritis from recent NSAID use); will give phenergan and GI cocktail to see if this helps; will reassess shortly  1:32 PM Pt feeling much better, tolerating PO well. Will send home with zantac/reglan/flexeril, advised use of tylenol and sparing use of motrin/NSAIDs only on full stomach, PRN tums/maalox discussed, diet modifications for GERD/gastritis advised; use of ice/heat advised. F/up with PCP in 1wk for recheck and ongoing management of her migraines, and to discuss possibly starting on a triptan (at this time, will defer to them for decisions regarding starting this medication since she will need good f/up if that med is started). I explained the diagnosis and have given explicit precautions to return to the ER including for any other new or worsening symptoms. The patient understands and accepts the medical plan as it's been dictated and I have answered their questions. Discharge instructions concerning home care and prescriptions have been given. The patient is STABLE and is discharged to home in good condition.   Final Clinical Impressions(s) / ED Diagnoses   Final diagnoses:  Migraine with aura and without status migrainosus, not intractable  Non-intractable vomiting with nausea, unspecified vomiting type  Abdominal pain, unspecified abdominal  location  Gastritis, presence of bleeding unspecified, unspecified chronicity, unspecified gastritis type  Muscle spasms of neck    New Prescriptions New Prescriptions   CYCLOBENZAPRINE (FLEXERIL) 10 MG TABLET    Take 1 tablet (10 mg total) by mouth 3 (three) times daily as needed for muscle spasms.   METOCLOPRAMIDE (REGLAN) 10 MG TABLET    Take 1 tablet (10 mg total) by mouth every 6 (six) hours as needed for nausea (nausea/headache).   RANITIDINE (ZANTAC) 150 MG TABLET    Take 1 tablet (150 mg total) by mouth 2 (two) times daily.     [address] Miranda Garber, PA-C 10/25/16 1332    Rolland PorterMark James, MD 11/04/16 2046

## 2016-10-25 NOTE — ED Notes (Signed)
ED Provider at bedside. 

## 2016-10-25 NOTE — Discharge Instructions (Signed)
Overall your symptoms seem most consistent with migraines, and your nausea and abdominal pain is likely in part due to your NSAID use recently as well as in part due to the migraines. Use reglan as directed as needed for nausea and headaches. Stay very well hydrated with plenty of water. Use tylenol as needed for pain. You may also use NSAIDs like ibuprofen/aleve/naprosyn/motrin/etc as well, but avoid taking these medications on an empty stomach as it can cause stomach irritation and worsen your nausea. May consider using over the counter zantac and/or tums/maalox as needed for additional relief of your abdominal pain/nausea, especially while taking NSAIDs, to help diminish the stomach irritation. Avoid spicy/fatty/acidic foods, avoid soda/coffee/tea/alcohol. Avoid laying down flat within 30 minutes of eating. Use flexeril as directed as needed for muscle spasms, but don't drive or operate machinery while taking this medication. May consider using ice or heat to the areas of pain, no more than 20 minutes per hour for each. Get plenty of rest. Follow up with your regular doctor in 1 week for recheck of symptoms and ongoing management of your headaches and symptoms. Return to the ER for changes or worsening symptoms.

## 2016-10-25 NOTE — ED Triage Notes (Signed)
Pt reports constant nausea, emesis, sharp RLQ abdominal pain and hunger pain accompanied by right upper and temporal headache x 2 weeks, wakes pt up at about 0200 every day. Reports aura of dizziness and blurred vision about about 1 hour before headache starts, which resolve with headache onset, dizziness returns with emesis. Sister has migraines, gave pt Rizatriptan to take which only provides very short term relief. Also taking ibuprofen, Goodee powder. Had migraines in childhood but they resolved, no migraines since childhood.

## 2016-10-25 NOTE — ED Notes (Signed)
Discharge instructions, follow up care, and rx x3 reviewed with patient. Patient verbalized understanding. 

## 2017-08-28 ENCOUNTER — Encounter (HOSPITAL_COMMUNITY): Payer: Self-pay | Admitting: Nurse Practitioner

## 2017-08-28 ENCOUNTER — Emergency Department (HOSPITAL_COMMUNITY)
Admission: EM | Admit: 2017-08-28 | Discharge: 2017-08-28 | Disposition: A | Discharge status: Home / Self Care | Payer: BLUE CROSS/BLUE SHIELD | Attending: Emergency Medicine | Admitting: Emergency Medicine

## 2017-08-28 ENCOUNTER — Other Ambulatory Visit: Payer: Self-pay

## 2017-08-28 DIAGNOSIS — Y990 Civilian activity done for income or pay: Secondary | ICD-10-CM | POA: Insufficient documentation

## 2017-08-28 DIAGNOSIS — Y929 Unspecified place or not applicable: Secondary | ICD-10-CM | POA: Insufficient documentation

## 2017-08-28 DIAGNOSIS — S161XXA Strain of muscle, fascia and tendon at neck level, initial encounter: Secondary | ICD-10-CM | POA: Insufficient documentation

## 2017-08-28 DIAGNOSIS — F17210 Nicotine dependence, cigarettes, uncomplicated: Secondary | ICD-10-CM | POA: Insufficient documentation

## 2017-08-28 DIAGNOSIS — Y939 Activity, unspecified: Secondary | ICD-10-CM | POA: Insufficient documentation

## 2017-08-28 DIAGNOSIS — M5441 Lumbago with sciatica, right side: Secondary | ICD-10-CM | POA: Insufficient documentation

## 2017-08-28 DIAGNOSIS — R202 Paresthesia of skin: Secondary | ICD-10-CM | POA: Insufficient documentation

## 2017-08-28 DIAGNOSIS — Y33XXXA Other specified events, undetermined intent, initial encounter: Secondary | ICD-10-CM | POA: Insufficient documentation

## 2017-08-28 DIAGNOSIS — Z79899 Other long term (current) drug therapy: Secondary | ICD-10-CM | POA: Insufficient documentation

## 2017-08-28 LAB — URINALYSIS, ROUTINE W REFLEX MICROSCOPIC
Bilirubin Urine: NEGATIVE
Glucose, UA: NEGATIVE mg/dL
Hgb urine dipstick: NEGATIVE
KETONES UR: 5 mg/dL — AB
LEUKOCYTES UA: NEGATIVE
Nitrite: NEGATIVE
PROTEIN: NEGATIVE mg/dL
Specific Gravity, Urine: 1.025 (ref 1.005–1.030)
pH: 6 (ref 5.0–8.0)

## 2017-08-28 LAB — I-STAT CHEM 8, ED
BUN: 9 mg/dL (ref 6–20)
CREATININE: 0.7 mg/dL (ref 0.44–1.00)
Calcium, Ion: 1.15 mmol/L (ref 1.15–1.40)
Chloride: 103 mmol/L (ref 101–111)
GLUCOSE: 120 mg/dL — AB (ref 65–99)
HEMATOCRIT: 48 % — AB (ref 36.0–46.0)
HEMOGLOBIN: 16.3 g/dL — AB (ref 12.0–15.0)
Potassium: 4 mmol/L (ref 3.5–5.1)
Sodium: 139 mmol/L (ref 135–145)
TCO2: 26 mmol/L (ref 22–32)

## 2017-08-28 LAB — POC URINE PREG, ED: PREG TEST UR: NEGATIVE

## 2017-08-28 MED ORDER — KETOROLAC TROMETHAMINE 30 MG/ML IJ SOLN
30.0000 mg | Freq: Once | INTRAMUSCULAR | Status: AC
  Administered 2017-08-28: 30 mg via INTRAMUSCULAR
  Filled 2017-08-28: qty 1

## 2017-08-28 MED ORDER — CYCLOBENZAPRINE HCL 10 MG PO TABS: 10.0000 mg | ORAL_TABLET | Freq: Two times a day (BID) | ORAL | 0 refills | Status: DC | PRN

## 2017-08-28 MED ORDER — IOPAMIDOL (ISOVUE-370) INJECTION 76%
INTRAVENOUS | Status: AC
  Filled 2017-08-28: qty 50

## 2017-08-28 MED ORDER — PREDNISONE 20 MG PO TABS: 20.0000 mg | ORAL_TABLET | Freq: Two times a day (BID) | ORAL | 0 refills | Status: DC

## 2017-08-28 MED ORDER — KETOROLAC TROMETHAMINE 30 MG/ML IJ SOLN: 30.0000 mg | Freq: Once | INTRAMUSCULAR | Status: DC

## 2017-08-28 MED ORDER — CYCLOBENZAPRINE HCL 10 MG PO TABS
10.0000 mg | ORAL_TABLET | Freq: Once | ORAL | Status: AC
  Administered 2017-08-28: 10 mg via ORAL
  Filled 2017-08-28: qty 1

## 2017-08-28 MED ORDER — ACETAMINOPHEN 325 MG PO TABS: 650.0000 mg | ORAL_TABLET | Freq: Once | ORAL | Status: DC

## 2017-08-28 NOTE — ED Notes (Signed)
Patient given urine cup for specimen.

## 2017-08-28 NOTE — ED Notes (Signed)
Pt verbalized understanding of discharge instructions and medications as well as AMA.

## 2017-08-28 NOTE — ED Notes (Signed)
PA Dayton ScrapeMurray advised that patient is now refusing CT scan, PA made patient aware that she will have to sign out AMA if she leaves prior to scan. Pt agreed to sign out ama at this time

## 2017-08-28 NOTE — ED Triage Notes (Signed)
Pt states was stepping up on stair three days ago and had shooting pain from lower back that shot down right leg and up to right shoulder. Pt sts since then has shooting pain down right leg and is unable to stand up straight due to pain. Pt sts she has right sided stiffness feels it is associated to lack of movement due to pain. Patient has tried tylenol with minimal relief of symptoms. Pt denies fevers, chills, incontinence, dizziness.

## 2017-08-28 NOTE — Discharge Instructions (Signed)
Please see the information and instructions below regarding your visit.  Your diagnoses today include:  1. Acute right-sided low back pain with right-sided sciatica   2. Strain of neck muscle, initial encounter    About diagnosis. Most episodes of acute low back pain are self-limited. Your exam was reassuring today that the source of your pain is not affecting the spinal cord and nerves that originate in the spinal cord.   If you have a history of disc herniation or arthritis in your spine, the nerves exiting the spine on one side get inflamed. This can cause severe pain. We call this radiculopathy. We do not always know what causes the sudden inflammation.  Your neck pain is likely caused by trapezius spasm.  I would like you to monitor your symptoms closely to make sure that they are not changing.  Tests performed today include: See side panel of your discharge paperwork for testing performed today. Vital signs are listed at the bottom of these instructions.   Medications prescribed:    Take any prescribed medications only as prescribed, and any over the counter medications only as directed on the packaging.  You are prescribed prednisone, a steroid in the ED  This is a medication to help reduce inflammation in the spine.  Common side effects include upset stomach/nausea. You may take this medicine with food if this occurs. Other side effects include restlessness, difficulty sleeping, and increased sweating. Call your healthcare provider if these do not resolve after finishing the medication.  This medicine may increase your blood sugar so additional careful monitoring is needed of blood sugar if you have diabetes. Call your healthcare provider for any signs/symtpoms of high blood sugar such as confusion, feeling sleepy, more thirst, more hunger, passing urine more often, flushing, fast breathing, or breath that smells like fruit.  You are prescribed Flexeril, a muscle relaxant. Some  common side effects of this medication include:  Feeling sleepy.  Dizziness. Take care upon going from a seated to a standing position.  Dry mouth.  Feeling tired or weak.  Hard stools (constipation).  Upset stomach. These are not all of the side effects that may occur. If you have questions about side effects, call your doctor. Call your primary care provider for medical advice about side effects.  This medication can be sedating. Only take this medication as needed. Please do not combine with alcohol. Do not drive or operate machinery while taking this medication.   This medication can interact with some other medications. Make sure to tell any provider you are taking this medication before they prescribe you a new medication.    Home care instructions:   Low back pain gets worse the longer you stay stationary. Please keep moving and walking as tolerated. There are exercises included in this packet to perform as tolerated for your low back pain.   Apply heat to the areas that are painful. Avoid twisting or bending your trunk to lift something. Do not lift anything above 25 lbs while recovering from this flare of low back pain.  Please follow any educational materials contained in this packet.     Follow-up instructions: Please follow-up with your primary care provider as soon as possible for further evaluation of your symptoms if they are not completely improved.   Please follow up with Dr. Pearletha ForgeHudnall in sports medicine for cervical and lumbar strain.  Return instructions:  Please return to the Emergency Department if you experience worsening symptoms.  Please return for any  fever or chills in the setting of your back pain, weakness in the muscles of the legs, numbness in your legs and feet that is new or changing, numbness in the area where you wipe, retention of your urine, loss of bowel or bladder control, or problems with walking. Please return to the emergency department if you  have any blurry vision, double vision, dizziness, feeling that the room is spinning or you are spinning.  Please return if you have any other emergent concerns.  Additional Information:   Your vital signs today were: BP 124/62    Pulse 85    Temp 99.1 F (37.3 C) (Oral)    Resp 18    Ht 5\' 6"  (1.676 m)    Wt 90.7 kg (200 lb)    LMP 08/12/2017    SpO2 100%    BMI 32.28 kg/m  If your blood pressure (BP) was elevated on multiple readings during this visit above 130 for the top number or above 80 for the bottom number, please have this repeated by your primary care provider within one month. --------------  Thank you for allowing us to participate in your care today.

## 2017-08-28 NOTE — ED Provider Notes (Signed)
[facility] EMERGENCY DEPARTMENT Provider Note   CSN: [id number] Arrival date & time: 08/28/17  0945     History   Chief Complaint Chief Complaint  Patient presents with  . Back Pain    HPI [name] is a 31 y.o. female.  HPI   Patient is a 31 year old female with a history of HPV infection presenting for shooting pains down her right leg in addition to some neck and arm discomfort.  Patient reports that 3 days ago at work she was getting up on a step and felt a shooting pain down her right leg.  Since then she has had a pain in her right hip.  She did not fall.  Patient reports that she has no numbness or muscular weakness of the lower extremity.  Patient denies any loss of bowel or bladder control, saddle anesthesia, IV drug use, fever or chill, or history of cancer.  No recent spinal procedures.  Patient reports that yesterday she noted that her right arm had some paresthesias and feels tense.  Patient reports this begins in the musculature between the neck and the shoulder.  Patient denies any blurry vision, diplopia, or vertiginous symptoms.  Patient denies lightheadedness or syncope.  Past Medical History:  Diagnosis Date  . HPV (human papilloma virus) infection     There are no active problems to display for this patient.   Past Surgical History:  Procedure Laterality Date  . Diagnostic  Laparoscopy,  Left Salpingectomy with Removal Ectopic Pregnancy N/A 03/27/2016   Performed by Tereso Newcomer, MD at [facility]  . WISDOM TOOTH EXTRACTION      OB History    Gravida Para Term Preterm AB Living   1 0 0 0 1 0   SAB TAB Ectopic Multiple Live Births   0 0 1 0         Home Medications    Prior to Admission medications   Medication Sig Start Date End Date Taking? Authorizing Provider  Aspirin-Acetaminophen-Caffeine (GOODYS EXTRA STRENGTH) [phone] MG PACK Take 2 packets by mouth daily as needed (HEADACHE).    [provider]    cyclobenzaprine (FLEXERIL) 10 MG tablet Take 1 tablet (10 mg total) 2 (two) times daily as needed by mouth for muscle spasms. 08/28/17   Aviva Kluver B, PA-C  ibuprofen (ADVIL,MOTRIN) 600 MG tablet Take 1 tablet (600 mg total) by mouth every 6 (six) hours as needed. Patient not taking: Reported on 10/25/2016 03/27/16   Anyanwu, Jethro Bastos, MD  metoCLOPramide (REGLAN) 10 MG tablet Take 1 tablet (10 mg total) by mouth every 6 (six) hours as needed for nausea (nausea/headache). 10/25/16   Street, [place], PA-C  oxyCODONE-acetaminophen (PERCOCET/ROXICET) 5-325 MG tablet Take 1-2 tablets by mouth every 6 (six) hours as needed. Patient not taking: Reported on 10/25/2016 03/27/16   Anyanwu, Jethro Bastos, MD  predniSONE (DELTASONE) 20 MG tablet Take 1 tablet (20 mg total) 2 (two) times daily with a meal by mouth. 08/28/17   Aviva Kluver B, PA-C  ranitidine (ZANTAC) 150 MG tablet Take 1 tablet (150 mg total) by mouth 2 (two) times daily. 10/25/16   Street, [place] Wheeler, PA-C    Family History No family history on file.  Social History Social History   Tobacco Use  . Smoking status: Current Every Day Smoker    Packs/day: 0.25    Types: Cigarettes    Last attempt to quit: 03/13/2016    Years since quitting: 1.4  . Smokeless tobacco:  Never Used  Substance Use Topics  . Alcohol use: Yes    Alcohol/week: 2.4 oz    Types: 4 Cans of beer per week    Comment: occ when not pregnant  . Drug use: Yes    Types: Marijuana    Comment: no longer since she found out she was pregnant     Allergies   Patient has no known allergies.   Review of Systems Review of Systems  Constitutional: Negative for chills and fever.  Gastrointestinal: Negative for abdominal pain.  Genitourinary: Negative for dysuria.  Musculoskeletal: Positive for arthralgias, back pain and neck pain.  Neurological: Negative for weakness and numbness.     Physical Exam Updated Vital Signs BP 124/62   Pulse 85   Temp 99.1 F (37.3 C)  (Oral)   Resp 18   Ht 5\' 6"  (1.676 m)   Wt 90.7 kg (200 lb)   LMP 08/12/2017   SpO2 100%   BMI 32.28 kg/m   Physical Exam  Constitutional: She appears well-developed and well-nourished. No distress.  Sitting comfortably in examining chair.  HENT:  Head: Normocephalic and atraumatic.  Eyes: Conjunctivae are normal. Right eye exhibits no discharge. Left eye exhibits no discharge.  EOMs normal to gross examination.  Neck: Normal range of motion.  Cardiovascular: Normal rate, regular rhythm and intact distal pulses.  Pulmonary/Chest:  Normal respiratory effort. Patient converses comfortably. No audible wheeze or stridor.  Abdominal: She exhibits no distension.  Musculoskeletal: Normal range of motion.  PALPATION: No midline but paraspinal musculature tenderness of cervical and thoracic spine.  Point tenderness over trapezius muscle and muscular spasm noted. No crepitus or step-off. ROM of cervical spine intact with flexion/extension/lateral flexion/lateral rotation; Patient can laterally rotate cervical spine greater than 45 degrees. Limitations in movement due to pain with lateral rotation to the right. MOTOR: 5/5 strength b/l with resisted shoulder abduction/adduction, biceps flexion (C5/6), biceps extension (C6-C8), wrist flexion, wrist extension (C6-C8), and grip strength (C7-T1) 2+ DTRs in the biceps and triceps SENSORY: Sensation is intact to light touch in:  Superficial radial nerve distribution (dorsal first web space) Median nerve distribution (tip of index finger)   Ulnar nerve distribution (tip of small finger)   Spine Exam: Inspection/Palpation: Tenderness to palpation over right sacroiliac joint.  No paraspinal or lumbar midline muscular tenderness. No crepitus or step-off. Strength: 5/5 throughout LE bilaterally (hip flexion/extension, adduction/abduction; knee flexion/extension; foot dorsiflexion/plantarflexion, inversion/eversion; great toe inversion) Sensation: Intact  to light touch in proximal and distal LE bilaterally Reflexes: 2+ quadriceps and achilles reflexes Patient ambulates with an antalgic gait, however no evidence of foot drop or muscular weakness.   Neurological: She is alert.  Cranial nerves intact to gross observation. Patient moves extremities without difficulty.  Skin: Skin is warm and dry. She is not diaphoretic.  Psychiatric: She has a normal mood and affect. Her behavior is normal. Judgment and thought content normal.  Nursing note and vitals reviewed.    ED Treatments / Results  Labs (all labs ordered are listed, but only abnormal results are displayed) Labs Reviewed  URINALYSIS, ROUTINE W REFLEX MICROSCOPIC - Abnormal; Notable for the following components:      Result Value   Ketones, ur 5 (*)    All other components within normal limits  I-STAT CHEM 8, ED - Abnormal; Notable for the following components:   Glucose, Bld 120 (*)    Hemoglobin 16.3 (*)    HCT 48.0 (*)    All other components within normal limits  POC URINE PREG, ED    EKG  EKG Interpretation None       Radiology No results found.  Procedures Procedures (including critical care time)  Medications Ordered in ED Medications  iopamidol (ISOVUE-370) 76 % injection (not administered)  acetaminophen (TYLENOL) tablet 650 mg (650 mg Oral Refused 08/28/17 1658)  cyclobenzaprine (FLEXERIL) tablet 10 mg (10 mg Oral Given 08/28/17 1348)  ketorolac (TORADOL) 30 MG/ML injection 30 mg (30 mg Intramuscular Given 08/28/17 1348)     Initial Impression / Assessment and Plan / ED Course  I have reviewed the triage vital signs and the nursing notes.  Pertinent labs & imaging results that were available during my care of the patient were reviewed by me and considered in my medical decision making (see chart for details).    Final Clinical Impressions(s) / ED Diagnoses   Final diagnoses:  Acute right-sided low back pain with right-sided sciatica  Strain of  neck muscle, initial encounter   On initial evaluation, patient is well-appearing but uncomfortable.  Patient is neurovascularly intact in upper and lower extremities.  Will order Flexeril and Toradol for pain.  I suspect trapezius spasm, as patient is point tender over the right trapezius muscle.  This may be causing the patient's paresthesias and discomfort in the right arm.  Patient symptoms in the right lower leg consistent with sciatica.  Patient is neurovascularly intact in the lower extremities.  Will obtain UA and urine pregnancy to rule out urinary or pregnancy-related causes of LBP.   On reevaluation, patient had brief improvement with Flexeril and Toradol.  Case discussed with Dr. Gwyneth SproutWhitney Plunkett.  Due to the sudden onset of right neck symptoms with twisting, will pursue workup for vertebral artery dissection.  Optimal study for deep vertebral artery dissection was discussed with interventional radiologist on call.  Per interventional radiology, patient symptoms are low likelihood for vertebral artery dissection, and recommendation was to only obtain CT angiogram of the neck.  Patient initially consented to CT, but later reported she did not want to stay as long as it would take to obtain the study.  I discussed that her departure from the department will be leaving against medical advice, and patient signed appropriate paperwork.  Prescribed Flexeril and short course of prednisone for radicular symptoms of back.  These prescriptions are deemed appropriate despite patient not completing workup. Strict return precautions were given for any vertigo, dizziness, lightheadedness, diplopia, or changes in vision.  Return precautions for any acute low back symptoms such as loss of bowel or bladder control, weakness, numbness, or saddle anesthesia of the lower extremities.  Patient is in understanding that it is against medical advice to leave the department at this time.   ED Discharge Orders         Ordered    predniSONE (DELTASONE) 20 MG tablet  2 times daily with meals     08/28/17 1655    cyclobenzaprine (FLEXERIL) 10 MG tablet  2 times daily PRN     08/28/17 1655       Delia ChimesMurray, Alyssa B, PA-C 08/28/17 1924    Gwyneth SproutPlunkett, Whitney, MD 08/28/17 2016

## 2017-08-28 NOTE — ED Notes (Signed)
ED Provider at bedside. 

## 2018-10-02 ENCOUNTER — Emergency Department (HOSPITAL_COMMUNITY)
Admission: EM | Admit: 2018-10-02 | Discharge: 2018-10-02 | Disposition: A | Discharge status: Home / Self Care | Payer: Self-pay | Attending: Emergency Medicine | Admitting: Emergency Medicine

## 2018-10-02 ENCOUNTER — Emergency Department (HOSPITAL_COMMUNITY): Payer: Self-pay

## 2018-10-02 ENCOUNTER — Encounter (HOSPITAL_COMMUNITY): Payer: Self-pay

## 2018-10-02 DIAGNOSIS — R112 Nausea with vomiting, unspecified: Secondary | ICD-10-CM | POA: Insufficient documentation

## 2018-10-02 DIAGNOSIS — R748 Abnormal levels of other serum enzymes: Secondary | ICD-10-CM | POA: Insufficient documentation

## 2018-10-02 DIAGNOSIS — Z79899 Other long term (current) drug therapy: Secondary | ICD-10-CM | POA: Insufficient documentation

## 2018-10-02 DIAGNOSIS — R0602 Shortness of breath: Secondary | ICD-10-CM | POA: Insufficient documentation

## 2018-10-02 DIAGNOSIS — R6883 Chills (without fever): Secondary | ICD-10-CM | POA: Insufficient documentation

## 2018-10-02 DIAGNOSIS — F17210 Nicotine dependence, cigarettes, uncomplicated: Secondary | ICD-10-CM | POA: Insufficient documentation

## 2018-10-02 DIAGNOSIS — R1013 Epigastric pain: Secondary | ICD-10-CM | POA: Insufficient documentation

## 2018-10-02 LAB — COMPREHENSIVE METABOLIC PANEL
ALK PHOS: 44 U/L (ref 38–126)
ALT: 19 U/L (ref 0–44)
ANION GAP: 11 (ref 5–15)
AST: 20 U/L (ref 15–41)
Albumin: 4.2 g/dL (ref 3.5–5.0)
BUN: 5 mg/dL — ABNORMAL LOW (ref 6–20)
CALCIUM: 9.1 mg/dL (ref 8.9–10.3)
CO2: 24 mmol/L (ref 22–32)
CREATININE: 0.86 mg/dL (ref 0.44–1.00)
Chloride: 104 mmol/L (ref 98–111)
Glucose, Bld: 86 mg/dL (ref 70–99)
Potassium: 3.4 mmol/L — ABNORMAL LOW (ref 3.5–5.1)
SODIUM: 139 mmol/L (ref 135–145)
Total Bilirubin: 0.3 mg/dL (ref 0.3–1.2)
Total Protein: 7.5 g/dL (ref 6.5–8.1)

## 2018-10-02 LAB — CBC WITH DIFFERENTIAL/PLATELET
Abs Immature Granulocytes: 0.01 10*3/uL (ref 0.00–0.07)
BASOS PCT: 0 %
Basophils Absolute: 0 10*3/uL (ref 0.0–0.1)
EOS PCT: 2 %
Eosinophils Absolute: 0.1 10*3/uL (ref 0.0–0.5)
HCT: 42.4 % (ref 36.0–46.0)
HEMOGLOBIN: 13.7 g/dL (ref 12.0–15.0)
Immature Granulocytes: 0 %
Lymphocytes Relative: 36 %
Lymphs Abs: 2.7 10*3/uL (ref 0.7–4.0)
MCH: 28.8 pg (ref 26.0–34.0)
MCHC: 32.3 g/dL (ref 30.0–36.0)
MCV: 89.3 fL (ref 80.0–100.0)
MONO ABS: 0.7 10*3/uL (ref 0.1–1.0)
MONOS PCT: 10 %
Neutro Abs: 3.8 10*3/uL (ref 1.7–7.7)
Neutrophils Relative %: 52 %
PLATELETS: 222 10*3/uL (ref 150–400)
RBC: 4.75 MIL/uL (ref 3.87–5.11)
RDW: 15.8 % — ABNORMAL HIGH (ref 11.5–15.5)
WBC: 7.3 10*3/uL (ref 4.0–10.5)
nRBC: 0 % (ref 0.0–0.2)

## 2018-10-02 LAB — URINALYSIS, ROUTINE W REFLEX MICROSCOPIC
Glucose, UA: NEGATIVE mg/dL
Ketones, ur: >80 mg/dL — AB
Leukocytes, UA: NEGATIVE
NITRITE: NEGATIVE
PROTEIN: NEGATIVE mg/dL
SPECIFIC GRAVITY, URINE: 1.025 (ref 1.005–1.030)
pH: 6 (ref 5.0–8.0)

## 2018-10-02 LAB — I-STAT TROPONIN, ED: TROPONIN I, POC: 0.01 ng/mL (ref 0.00–0.08)

## 2018-10-02 LAB — URINALYSIS, MICROSCOPIC (REFLEX): WBC, UA: NONE SEEN WBC/hpf (ref 0–5)

## 2018-10-02 LAB — LIPASE, BLOOD: LIPASE: 58 U/L — AB (ref 11–51)

## 2018-10-02 LAB — HCG, SERUM, QUALITATIVE: PREG SERUM: NEGATIVE

## 2018-10-02 MED ORDER — IOHEXOL 300 MG/ML  SOLN
100.0000 mL | Freq: Once | INTRAMUSCULAR | Status: AC | PRN
  Administered 2018-10-02: 100 mL via INTRAVENOUS

## 2018-10-02 MED ORDER — ONDANSETRON HCL 4 MG/2ML IJ SOLN
4.0000 mg | Freq: Once | INTRAMUSCULAR | Status: AC
  Administered 2018-10-02: 4 mg via INTRAVENOUS
  Filled 2018-10-02: qty 2

## 2018-10-02 MED ORDER — SODIUM CHLORIDE 0.9 % IV BOLUS
1000.0000 mL | Freq: Once | INTRAVENOUS | Status: AC
  Administered 2018-10-02: 1000 mL via INTRAVENOUS

## 2018-10-02 MED ORDER — PANTOPRAZOLE SODIUM 20 MG PO TBEC: 20.0000 mg | DELAYED_RELEASE_TABLET | Freq: Every day | ORAL | 0 refills | Status: DC

## 2018-10-02 MED ORDER — FENTANYL CITRATE (PF) 100 MCG/2ML IJ SOLN
25.0000 ug | Freq: Once | INTRAMUSCULAR | Status: AC
  Administered 2018-10-02: 25 ug via INTRAVENOUS
  Filled 2018-10-02: qty 2

## 2018-10-02 NOTE — ED Provider Notes (Signed)
[facility] EMERGENCY DEPARTMENT Provider Note   CSN: [id number] Arrival date & time: 10/02/18  1505     History   Chief Complaint Chief Complaint  Patient presents with  . Chest Pain    HPI [name] is a 32 y.o. female.  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  Scribes her pain as spasms in the upper abdominal region, reports the symptoms worsen yesterday.  She reports this pain is better with pressured.  He has tried Tylenol, Alka-Seltzer but reports mild relieving symptoms.  She also reports nausea along with 6-7 episodes of vomiting and chills.  She denies any fever, diarrhea, chest pain, reports shortness of breath with vomiting.  Last bowel movement was 2 days ago and it was normal.  Does have a previous abdominal surgical history of ectopic pregnancy.     Past Medical History:  Diagnosis Date  . HPV (human papilloma virus) infection     There are no active problems to display for this patient.   Past Surgical History:  Procedure Laterality Date  . DIAGNOSTIC LAPAROSCOPY WITH REMOVAL OF ECTOPIC PREGNANCY N/A 03/27/2016   Procedure: Diagnostic  Laparoscopy,  Left Salpingectomy with Removal Ectopic Pregnancy;  Surgeon: Tereso Newcomer, MD;  Location: [facility];  Service: Gynecology;  Laterality: N/A;  . WISDOM TOOTH EXTRACTION       OB History    Gravida  1   Para  0   Term  0   Preterm  0   AB  1   Living  0     SAB  0   TAB  0   Ectopic  1   Multiple  0   Live Births               Home Medications    Prior to Admission medications   Medication Sig Start Date End Date Taking? Authorizing Provider  Aspirin-Acetaminophen-Caffeine (GOODYS EXTRA STRENGTH) [phone] MG PACK Take 2 packets by mouth daily as needed (HEADACHE).    [provider]  cyclobenzaprine (FLEXERIL) 10 MG tablet Take 1 tablet (10 mg total) 2 (two) times daily as needed  by mouth for muscle spasms. 08/28/17   Aviva Kluver B, PA-C  ibuprofen (ADVIL,MOTRIN) 600 MG tablet Take 1 tablet (600 mg total) by mouth every 6 (six) hours as needed. 03/27/16   Anyanwu, Jethro Bastos, MD  metoCLOPramide (REGLAN) 10 MG tablet Take 1 tablet (10 mg total) by mouth every 6 (six) hours as needed for nausea (nausea/headache). 10/25/16   Street, [place], PA-C  oxyCODONE-acetaminophen (PERCOCET/ROXICET) 5-325 MG tablet Take 1-2 tablets by mouth every 6 (six) hours as needed. 03/27/16   Anyanwu, Jethro Bastos, MD  pantoprazole (PROTONIX) 20 MG tablet Take 1 tablet (20 mg total) by mouth daily. 10/02/18 11/01/18  Claude Manges, PA-C  predniSONE (DELTASONE) 20 MG tablet Take 1 tablet (20 mg total) 2 (two) times daily with a meal by mouth. 08/28/17   Aviva Kluver B, PA-C  ranitidine (ZANTAC) 150 MG tablet Take 1 tablet (150 mg total) by mouth 2 (two) times daily. 10/25/16   Street, [place], PA-C    Family History History reviewed. No pertinent family history.  Social History Social History   Tobacco Use  . Smoking status: Current Every Day Smoker    Packs/day: 0.25    Types: Cigarettes  . Smokeless tobacco: Never Used  Substance Use Topics  .  Alcohol use: Yes    Alcohol/week: 4.0 standard drinks    Types: 4 Cans of beer per week  . Drug use: Yes    Types: Marijuana    Comment: no longer since she found out she was pregnant     Allergies   Patient has no known allergies.   Review of Systems Review of Systems  Constitutional: Positive for chills. Negative for fever.  HENT: Negative for sore throat.   Respiratory: Negative for shortness of breath.   Cardiovascular: Negative for chest pain.  Gastrointestinal: Positive for abdominal pain, nausea and vomiting. Negative for diarrhea.  Genitourinary: Negative for dysuria and flank pain.  Musculoskeletal: Negative for back pain.  Skin: Negative for pallor and wound.  Neurological: Negative for dizziness, light-headedness and  headaches.  All other systems reviewed and are negative.    Physical Exam Updated Vital Signs BP 128/88   Pulse 75   Temp 98.1 F (36.7 C) (Oral)   Resp 18   Ht 5\' 6"  (1.676 m)   Wt 90.7 kg   LMP 09/30/2018   SpO2 100%   BMI 32.28 kg/m   Physical Exam Vitals signs and nursing note reviewed.  Constitutional:      General: She is not in acute distress.    Appearance: She is well-developed.  HENT:     Head: Normocephalic and atraumatic.     Mouth/Throat:     Pharynx: No oropharyngeal exudate.  Eyes:     Pupils: Pupils are equal, round, and reactive to light.  Neck:     Musculoskeletal: Normal range of motion.  Cardiovascular:     Rate and Rhythm: Regular rhythm.     Heart sounds: Normal heart sounds.  Pulmonary:     Effort: Pulmonary effort is normal. No respiratory distress.     Breath sounds: Normal breath sounds.  Abdominal:     General: Abdomen is flat. Bowel sounds are normal. There is no distension.     Palpations: Abdomen is soft.     Tenderness: There is abdominal tenderness in the epigastric area. There is no right CVA tenderness or left CVA tenderness. Negative signs include Murphy's sign and Rovsing's sign.    Musculoskeletal:        General: No tenderness or deformity.     Right lower leg: No edema.     Left lower leg: No edema.  Skin:    General: Skin is warm and dry.  Neurological:     Mental Status: She is alert and oriented to person, place, and time.      ED Treatments / Results  Labs (all labs ordered are listed, but only abnormal results are displayed) Labs Reviewed  CBC WITH DIFFERENTIAL/PLATELET - Abnormal; Notable for the following components:      Result Value   RDW 15.8 (*)    All other components within normal limits  COMPREHENSIVE METABOLIC PANEL - Abnormal; Notable for the following components:   Potassium 3.4 (*)    BUN 5 (*)    All other components within normal limits  LIPASE, BLOOD - Abnormal; Notable for the following  components:   Lipase 58 (*)    All other components within normal limits  URINALYSIS, ROUTINE W REFLEX MICROSCOPIC - Abnormal; Notable for the following components:   Hgb urine dipstick SMALL (*)    Bilirubin Urine SMALL (*)    Ketones, ur >80 (*)    All other components within normal limits  URINALYSIS, MICROSCOPIC (REFLEX) - Abnormal; Notable for the  following components:   Bacteria, UA PRESENT (*)    All other components within normal limits  HCG, SERUM, QUALITATIVE  I-STAT TROPONIN, ED    EKG EKG Interpretation  Date/Time:  Saturday October 02 2018 15:31:35 EST Ventricular Rate:  94 PR Interval:    QRS Duration: 89 QT Interval:  340 QTC Calculation: 426 R Axis:   7 Text Interpretation:  Sinus rhythm Baseline wander in lead(s) V3 axis change from prior 5/14 Confirmed by Meridee ScoreButler, Michael [phone]([id number]) on 10/02/2018 3:46:08 PM   Radiology Dg Chest 2 View  Result Date: 10/02/2018 CLINICAL DATA:  Nausea, vomiting, central anterior chest pain, and cough for few days, shaky, smoker EXAM: CHEST - 2 VIEW COMPARISON:  11/25/2006 FINDINGS: Normal heart size, mediastinal contours, and pulmonary vascularity. Lungs clear. No pulmonary infiltrate, pleural effusion or pneumothorax. Bones unremarkable. IMPRESSION: No acute abnormalities. Electronically Signed   By: Ulyses SouthwardMark  Boles [name]   On: 10/02/2018 15:56   Ct Abdomen Pelvis W Contrast  Result Date: 10/02/2018 CLINICAL DATA:  New onset epigastric pain. Prior LEFT salpingectomy. EXAM: CT ABDOMEN AND PELVIS WITH CONTRAST TECHNIQUE: Multidetector CT imaging of the abdomen and pelvis was performed using the standard protocol following bolus administration of intravenous contrast. CONTRAST:  100mL OMNIPAQUE IOHEXOL 300 MG/ML  SOLN COMPARISON:  CT 06/22/2018 FINDINGS: Lower chest: Lung bases are clear. Hepatobiliary: No focal hepatic lesion. No biliary duct dilatation. Gallbladder is normal. Common bile duct is normal. Pancreas: Pancreas is normal. No  ductal dilatation. No pancreatic inflammation. Spleen: Normal spleen Adrenals/urinary tract: Adrenal glands and kidneys are normal. The ureters and bladder normal. Stomach/Bowel: Stomach, small bowel, appendix, and cecum are normal. The colon and rectosigmoid colon are normal. Vascular/Lymphatic: Abdominal aorta is normal caliber. No periportal or retroperitoneal adenopathy. No pelvic adenopathy. Reproductive: Uterus and adnexa normal. Other: No free fluid. Musculoskeletal: No aggressive osseous lesion. IMPRESSION: No acute findings in the abdomen pelvis. No explanation for epigastric pain. Normal appendix. Electronically Signed   By: Genevive BiStewart  Edmunds [name]   On: 10/02/2018 18:42    Procedures Procedures (including critical care time)  Medications Ordered in ED Medications  sodium chloride 0.9 % bolus 1,000 mL (1,000 mLs Intravenous New Bag/Given 10/02/18 1613)  ondansetron (ZOFRAN) injection 4 mg (4 mg Intravenous Given 10/02/18 1614)  fentaNYL (SUBLIMAZE) injection 25 mcg (25 mcg Intravenous Given 10/02/18 1749)  iohexol (OMNIPAQUE) 300 MG/ML solution 100 mL (100 mLs Intravenous Contrast Given 10/02/18 1813)     Initial Impression / Assessment and Plan / ED Course  I have reviewed the triage vital signs and the nursing notes.  Pertinent labs & imaging results that were available during my care of the patient were reviewed by me and considered in my medical decision making (see chart for details).    Presents with epigastric pain which began a week ago that has worsened yesterday.  She reports his pain as pressure along the epigastric region with no radiation better with palpation.She does endorse daily alcohol intake but reports decreasing this lately. Workup in the ED showed slight decrease in potassium, creatine level stable, lfts are within normal limits, low suspicion for gallbladder pathology. CBC showed no leukocytosis, she's afebrile low suspicion for appendicitis. UA has some bacteria  presents but was also contaminated, she denies any urinary symptoms.  HCG was  Negative. Lipase was slightly elevated, some suspicion for pancreatitis as patient's pain has not improved with pain meds will order CT ABD to r/o any pathology. CT ABD and pelvis was negative for any acute  abnormality. Trop along with EKG was obtained due to patient complaints of epigastric pain radiating up her chest.   7:36 PM Patient reasesed by me, pain has improved after fluids and medication, I believe she is likely suffering from gastritis or acid reflux. Will try her on PPI and advised to follow up with PCP. She reports she does not have a PCP, will provide her with the number to [facility], light diet information will also be provided for patient. Vitals stable during visit, patient stable for discharge.   Final Clinical Impressions(s) / ED Diagnoses   Final diagnoses:  Epigastric pain  Elevated lipase    ED Discharge Orders         Ordered    pantoprazole (PROTONIX) 20 MG tablet  Daily     10/02/18 1940           Claude Manges, Cordelia Poche 10/02/18 1942    Alvira Monday, MD 10/03/18 [phone]

## 2018-10-02 NOTE — ED Triage Notes (Signed)
Onset 1 week mid/epigastric pain, vomiting.   Initially pain would be at night, resolving by morning.  Now pain is constant.   Last vomited today x 6 in past 24 hours.  Eating and drinking WNL.

## 2018-10-02 NOTE — Discharge Instructions (Addendum)
You CT Scan of the abdomen was normal, I have prescribed medication to help with your abdominal discomfort. Please avoid spicy food and alcohol.I [facility],please establish care with a primary care physician. If you experience any blood in your vomit, fever, worsening symptoms please return to the ED.

## 2019-05-15 ENCOUNTER — Other Ambulatory Visit: Payer: Self-pay

## 2019-05-15 ENCOUNTER — Encounter (HOSPITAL_COMMUNITY): Payer: Self-pay

## 2019-05-15 ENCOUNTER — Emergency Department (HOSPITAL_COMMUNITY): Payer: Medicaid Other

## 2019-05-15 ENCOUNTER — Emergency Department (HOSPITAL_COMMUNITY)
Admission: EM | Admit: 2019-05-15 | Discharge: 2019-05-15 | Disposition: A | Discharge status: Home / Self Care | Payer: Medicaid Other | Attending: Emergency Medicine | Admitting: Emergency Medicine

## 2019-05-15 DIAGNOSIS — Z87891 Personal history of nicotine dependence: Secondary | ICD-10-CM | POA: Insufficient documentation

## 2019-05-15 DIAGNOSIS — R1013 Epigastric pain: Secondary | ICD-10-CM | POA: Diagnosis not present

## 2019-05-15 DIAGNOSIS — R109 Unspecified abdominal pain: Secondary | ICD-10-CM | POA: Diagnosis not present

## 2019-05-15 DIAGNOSIS — R112 Nausea with vomiting, unspecified: Secondary | ICD-10-CM | POA: Diagnosis not present

## 2019-05-15 DIAGNOSIS — F1290 Cannabis use, unspecified, uncomplicated: Secondary | ICD-10-CM | POA: Insufficient documentation

## 2019-05-15 LAB — URINALYSIS, ROUTINE W REFLEX MICROSCOPIC
Bacteria, UA: NONE SEEN
Glucose, UA: NEGATIVE mg/dL
Hgb urine dipstick: NEGATIVE
Ketones, ur: 80 mg/dL — AB
Leukocytes,Ua: NEGATIVE
Nitrite: NEGATIVE
Protein, ur: 100 mg/dL — AB
Specific Gravity, Urine: 1.029 (ref 1.005–1.030)
pH: 8 (ref 5.0–8.0)

## 2019-05-15 LAB — CBC WITH DIFFERENTIAL/PLATELET
Abs Immature Granulocytes: 0.02 10*3/uL (ref 0.00–0.07)
Basophils Absolute: 0 10*3/uL (ref 0.0–0.1)
Basophils Relative: 1 %
Eosinophils Absolute: 0.1 10*3/uL (ref 0.0–0.5)
Eosinophils Relative: 2 %
HCT: 43.2 % (ref 36.0–46.0)
Hemoglobin: 14.2 g/dL (ref 12.0–15.0)
Immature Granulocytes: 0 %
Lymphocytes Relative: 31 %
Lymphs Abs: 2.1 10*3/uL (ref 0.7–4.0)
MCH: 29.6 pg (ref 26.0–34.0)
MCHC: 32.9 g/dL (ref 30.0–36.0)
MCV: 90.2 fL (ref 80.0–100.0)
Monocytes Absolute: 0.6 10*3/uL (ref 0.1–1.0)
Monocytes Relative: 9 %
Neutro Abs: 3.8 10*3/uL (ref 1.7–7.7)
Neutrophils Relative %: 57 %
Platelets: 251 10*3/uL (ref 150–400)
RBC: 4.79 MIL/uL (ref 3.87–5.11)
RDW: 15.3 % (ref 11.5–15.5)
WBC: 6.6 10*3/uL (ref 4.0–10.5)
nRBC: 0 % (ref 0.0–0.2)

## 2019-05-15 LAB — COMPREHENSIVE METABOLIC PANEL
ALT: 18 U/L (ref 0–44)
AST: 18 U/L (ref 15–41)
Albumin: 4.3 g/dL (ref 3.5–5.0)
Alkaline Phosphatase: 53 U/L (ref 38–126)
Anion gap: 10 (ref 5–15)
BUN: 5 mg/dL — ABNORMAL LOW (ref 6–20)
CO2: 23 mmol/L (ref 22–32)
Calcium: 9.3 mg/dL (ref 8.9–10.3)
Chloride: 110 mmol/L (ref 98–111)
Creatinine, Ser: 0.86 mg/dL (ref 0.44–1.00)
GFR calc Af Amer: >60 mL/min (ref >60)
GFR calc non Af Amer: >60 mL/min (ref >60)
Glucose, Bld: 119 mg/dL — ABNORMAL HIGH (ref 70–99)
Potassium: 3.9 mmol/L (ref 3.5–5.1)
Sodium: 143 mmol/L (ref 135–145)
Total Bilirubin: 0.3 mg/dL (ref 0.3–1.2)
Total Protein: 7.5 g/dL (ref 6.5–8.1)

## 2019-05-15 LAB — I-STAT BETA HCG BLOOD, ED (MC, WL, AP ONLY): I-stat hCG, quantitative: <5 m[IU]/mL (ref <5)

## 2019-05-15 LAB — LIPASE, BLOOD: Lipase: 20 U/L (ref 11–51)

## 2019-05-15 MED ORDER — SUCRALFATE 1 G PO TABS: 1.0000 g | ORAL_TABLET | Freq: Four times a day (QID) | ORAL | 0 refills | Status: DC | PRN

## 2019-05-15 MED ORDER — ONDANSETRON 4 MG PO TBDP: 4.0000 mg | ORAL_TABLET | Freq: Three times a day (TID) | ORAL | 0 refills | Status: DC | PRN

## 2019-05-15 MED ORDER — SODIUM CHLORIDE 0.9 % IV BOLUS
1000.0000 mL | Freq: Once | INTRAVENOUS | Status: AC
  Administered 2019-05-15: 1000 mL via INTRAVENOUS

## 2019-05-15 MED ORDER — HALOPERIDOL LACTATE 5 MG/ML IJ SOLN
2.0000 mg | Freq: Once | INTRAMUSCULAR | Status: AC
  Administered 2019-05-15: 2 mg via INTRAVENOUS
  Filled 2019-05-15: qty 1

## 2019-05-15 NOTE — ED Triage Notes (Signed)
Patient complains of recent GI distress. Developed nausea, vomiting and abdominal pain yesterday. Arrived with epigastric burning and hyperventilating. No active vomiting. Alert and oriented

## 2019-05-15 NOTE — ED Notes (Signed)
Pt verbalized understanding of discharge paperwork, prescriptions, and follow-up care. 

## 2019-05-15 NOTE — ED Provider Notes (Signed)
[facility] Emergency Department Provider Note MRN:  [id number]  Arrival date & time: 05/15/19     Chief Complaint   vomiting/ hyperventilating   History of Present Illness   [name] is a 33 y.o. year-old female with no pertinent past medical history presenting to the ED with chief complaint of vomiting.  Patient has been unable to tolerate food or drink for the past 2 days.  She explains that she tries to eat because she is very hungry, the food seems to enter her stomach, and then she feels very nauseated, bloated, and then vomits.  She is endorsing pain in the epigastrium, moderate, constant.  She denies lower abdominal pain, no vaginal bleeding or discharge, no fever, no chest pain or shortness of breath.  She smokes marijuana daily.  Review of Systems  A complete 10 system review of systems was obtained and all systems are negative except as noted in the HPI and PMH.   Patient's Health History    Past Medical History:  Diagnosis Date  . HPV (human papilloma virus) infection     Past Surgical History:  Procedure Laterality Date  . DIAGNOSTIC LAPAROSCOPY WITH REMOVAL OF ECTOPIC PREGNANCY N/A 03/27/2016   Procedure: Diagnostic  Laparoscopy,  Left Salpingectomy with Removal Ectopic Pregnancy;  Surgeon: Tereso NewcomerUgonna A Anyanwu, MD;  Location: [facility];  Service: Gynecology;  Laterality: N/A;  . WISDOM TOOTH EXTRACTION      No family history on file.  Social History   Socioeconomic History  . Marital status: Married    Spouse name: Not on file  . Number of children: Not on file  . Years of education: Not on file  . Highest education level: Not on file  Occupational History  . Not on file  Social Needs  . Financial resource strain: Not on file  . Food insecurity    Worry: Not on file    Inability: Not on file  . Transportation needs    Medical: Not on file    Non-medical: Not on file  Tobacco Use  . Smoking status: Current Every Day Smoker    Packs/day:  0.25    Types: Cigarettes  . Smokeless tobacco: Never Used  Substance and Sexual Activity  . Alcohol use: Yes    Alcohol/week: 4.0 standard drinks    Types: 4 Cans of beer per week  . Drug use: Yes    Types: Marijuana    Comment: no longer since she found out she was pregnant  . Sexual activity: Yes    Birth control/protection: None  Lifestyle  . Physical activity    Days per week: Not on file    Minutes per session: Not on file  . Stress: Not on file  Relationships  . Social Musicianconnections    Talks on phone: Not on file    Gets together: Not on file    Attends religious service: Not on file    Active member of club or organization: Not on file    Attends meetings of clubs or organizations: Not on file    Relationship status: Not on file  . Intimate partner violence    Fear of current or ex partner: Not on file    Emotionally abused: Not on file    Physically abused: Not on file    Forced sexual activity: Not on file  Other Topics Concern  . Not on file  Social History Narrative  . Not on file     Physical  Exam  Vital Signs and Nursing Notes reviewed Vitals:   05/15/19 0915 05/15/19 1112  BP: (!) 118/56 120/63  Pulse: 91 60  Resp: 15 20  Temp:    SpO2: 96% 96%    CONSTITUTIONAL: Well-appearing, NAD NEURO:  Alert and oriented x 3, no focal deficits EYES:  eyes equal and reactive ENT/NECK:  no LAD, no JVD CARDIO: Regular rate, well-perfused, normal S1 and S2 PULM:  CTAB no wheezing or rhonchi GI/GU:  normal bowel sounds, non-distended, non-tender MSK/SPINE:  No gross deformities, no edema SKIN:  no rash, atraumatic PSYCH:  Appropriate speech and behavior  Diagnostic and Interventional Summary    EKG Interpretation  Date/Time:  Sunday May 15 2019 08:32:56 EDT Ventricular Rate:  81 PR Interval:  136 QRS Duration: 78 QT Interval:  356 QTC Calculation: 413 R Axis:   65 Text Interpretation:  Normal sinus rhythm with sinus arrhythmia Normal ECG Confirmed by  Kennis CarinaBero,  [phone]([id number]) on 05/15/2019 8:51:58 AM      Labs Reviewed  COMPREHENSIVE METABOLIC PANEL - Abnormal; Notable for the following components:      Result Value   Glucose, Bld 119 (*)    BUN 5 (*)    All other components within normal limits  URINALYSIS, ROUTINE W REFLEX MICROSCOPIC - Abnormal; Notable for the following components:   Color, Urine AMBER (*)    APPearance HAZY (*)    Bilirubin Urine SMALL (*)    Ketones, ur 80 (*)    Protein, ur 100 (*)    All other components within normal limits  CBC WITH DIFFERENTIAL/PLATELET  LIPASE, BLOOD  I-STAT BETA HCG BLOOD, ED ([facility], [facility], [facility] ONLY)    DG ABD ACUTE 2+V W 1V CHEST  Final Result      Medications  haloperidol lactate (HALDOL) injection 2 mg (2 mg Intravenous Given 05/15/19 0922)  sodium chloride 0.9 % bolus 1,000 mL (0 mLs Intravenous Stopped 05/15/19 1024)     Procedures Critical Care  ED Course and Medical Decision Making  I have reviewed the triage vital signs and the nursing notes.  Pertinent labs & imaging results that were available during my care of the patient were reviewed by me and considered in my medical decision making (see below for details).  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 intolerance, daily marijuana use.  Also considering gastric outlet obstruction given her description of the symptoms and how her intake seems to stop in her stomach and then cause vomiting.  Will obtain plain film abdominal series for closer evaluation, basic labs, reassess.  Clinical Course as of May 14 1125  Wynelle LinkSun May 15, 2019  [id number] EKG 12-Lead [WF]    Clinical Course User Index [WF] Gerome ApleyFaulkner, William J, Student-PA    Labs are reassuring, repeat abdominal exam also reassuring without tenderness.  Patient is feeling much better after IV Haldol and liter of fluids.  She is tolerating p.o. here in the emergency department.  Appropriate for discharge with supportive care at home.  After the  discussed management above, the patient was determined to be safe for discharge.  The patient was in agreement with this plan and all questions regarding their care were answered.  ED return precautions were discussed and the patient will return to the ED with any significant worsening of condition.  Elmer Sow M. Pilar PlateBero, MD [facility] [email or web address]  Final Clinical Impressions(s) / ED Diagnoses     ICD-10-CM   1. Non-intractable vomiting  with nausea, unspecified vomiting type  R11.2   2. Abdominal pain  R10.9 DG ABD ACUTE 2+V W 1V CHEST    DG ABD ACUTE 2+V W 1V CHEST    ED Discharge Orders         Ordered    sucralfate (CARAFATE) 1 g tablet  4 times daily PRN     05/15/19 1124    ondansetron (ZOFRAN ODT) 4 MG disintegrating tablet  Every 8 hours PRN     05/15/19 1126             Maudie Flakes, MD 05/15/19 1127

## 2019-05-15 NOTE — Discharge Instructions (Addendum)
You were evaluated in the Emergency Department and after careful evaluation, we did not find any emergent condition requiring admission or further testing in the hospital.  Your symptoms today seem to be due to possible inflammation of the lining of the stomach.  Your symptoms may also be related to your marijuana use.  We recommend refraining from marijuana use for 1 week and using the Carafate medication as needed for discomfort.  Please return to the Emergency Department if you experience any worsening of your condition.  We encourage you to follow up with a primary care provider.  Thank you for allowing [place] to be a part of your care.

## 2019-08-18 ENCOUNTER — Other Ambulatory Visit: Payer: Self-pay

## 2019-08-18 ENCOUNTER — Encounter (HOSPITAL_COMMUNITY): Payer: Self-pay | Admitting: Emergency Medicine

## 2019-08-18 ENCOUNTER — Emergency Department (HOSPITAL_COMMUNITY)
Admission: EM | Admit: 2019-08-18 | Discharge: 2019-08-19 | Discharge status: Against Medical Advice | Payer: Medicaid Other | Attending: Emergency Medicine | Admitting: Emergency Medicine

## 2019-08-18 DIAGNOSIS — R110 Nausea: Secondary | ICD-10-CM | POA: Diagnosis not present

## 2019-08-18 DIAGNOSIS — R55 Syncope and collapse: Secondary | ICD-10-CM | POA: Diagnosis not present

## 2019-08-18 DIAGNOSIS — Z5321 Procedure and treatment not carried out due to patient leaving prior to being seen by health care provider: Secondary | ICD-10-CM | POA: Diagnosis not present

## 2019-08-18 DIAGNOSIS — I959 Hypotension, unspecified: Secondary | ICD-10-CM | POA: Diagnosis not present

## 2019-08-18 DIAGNOSIS — R42 Dizziness and giddiness: Secondary | ICD-10-CM | POA: Diagnosis not present

## 2019-08-18 LAB — CBC WITH DIFFERENTIAL/PLATELET
Abs Immature Granulocytes: 0.03 10*3/uL (ref 0.00–0.07)
Basophils Absolute: 0 10*3/uL (ref 0.0–0.1)
Basophils Relative: 0 %
Eosinophils Absolute: 0.2 10*3/uL (ref 0.0–0.5)
Eosinophils Relative: 2 %
HCT: 40.6 % (ref 36.0–46.0)
Hemoglobin: 13.5 g/dL (ref 12.0–15.0)
Immature Granulocytes: 0 %
Lymphocytes Relative: 21 %
Lymphs Abs: 2.2 10*3/uL (ref 0.7–4.0)
MCH: 30.3 pg (ref 26.0–34.0)
MCHC: 33.3 g/dL (ref 30.0–36.0)
MCV: 91 fL (ref 80.0–100.0)
Monocytes Absolute: 1 10*3/uL (ref 0.1–1.0)
Monocytes Relative: 9 %
Neutro Abs: 7.2 10*3/uL (ref 1.7–7.7)
Neutrophils Relative %: 68 %
Platelets: 212 10*3/uL (ref 150–400)
RBC: 4.46 MIL/uL (ref 3.87–5.11)
RDW: 16 % — ABNORMAL HIGH (ref 11.5–15.5)
WBC: 10.5 10*3/uL (ref 4.0–10.5)
nRBC: 0 % (ref 0.0–0.2)

## 2019-08-18 LAB — COMPREHENSIVE METABOLIC PANEL
ALT: 17 U/L (ref 0–44)
AST: 18 U/L (ref 15–41)
Albumin: 3.9 g/dL (ref 3.5–5.0)
Alkaline Phosphatase: 45 U/L (ref 38–126)
Anion gap: 9 (ref 5–15)
BUN: 7 mg/dL (ref 6–20)
CO2: 25 mmol/L (ref 22–32)
Calcium: 8.9 mg/dL (ref 8.9–10.3)
Chloride: 103 mmol/L (ref 98–111)
Creatinine, Ser: 0.81 mg/dL (ref 0.44–1.00)
GFR calc Af Amer: >60 mL/min (ref >60)
GFR calc non Af Amer: >60 mL/min (ref >60)
Glucose, Bld: 102 mg/dL — ABNORMAL HIGH (ref 70–99)
Potassium: 3.6 mmol/L (ref 3.5–5.1)
Sodium: 137 mmol/L (ref 135–145)
Total Bilirubin: 0.6 mg/dL (ref 0.3–1.2)
Total Protein: 6.9 g/dL (ref 6.5–8.1)

## 2019-08-18 LAB — I-STAT BETA HCG BLOOD, ED (MC, WL, AP ONLY): I-stat hCG, quantitative: <5 m[IU]/mL (ref <5)

## 2019-08-18 NOTE — ED Triage Notes (Signed)
Patient arrived with EMS from home reports brief episodes of syncope x2 this evening with lightheadedness/nausea and mild headache , alert and oriented at arrival , denies pain or injury , respirations unlabored. No neuro deficits . CBG= 153 by EMS .

## 2019-08-18 NOTE — ED Notes (Signed)
Pt called x2 no answer 

## 2019-08-19 NOTE — ED Notes (Signed)
Pt called x2 for vitals recheck, no answer. 

## 2019-08-24 ENCOUNTER — Encounter (HOSPITAL_COMMUNITY): Payer: Self-pay | Admitting: Emergency Medicine

## 2019-08-24 ENCOUNTER — Encounter: Payer: Self-pay | Admitting: Gastroenterology

## 2019-08-24 ENCOUNTER — Ambulatory Visit (HOSPITAL_COMMUNITY)
Admission: EM | Admit: 2019-08-24 | Discharge: 2019-08-24 | Disposition: A | Discharge status: Home / Self Care | Payer: Medicaid Other | Attending: Family Medicine | Admitting: Family Medicine

## 2019-08-24 ENCOUNTER — Encounter (HOSPITAL_COMMUNITY): Payer: Self-pay

## 2019-08-24 ENCOUNTER — Other Ambulatory Visit: Payer: Self-pay

## 2019-08-24 ENCOUNTER — Emergency Department (HOSPITAL_COMMUNITY)
Admission: EM | Admit: 2019-08-24 | Discharge: 2019-08-24 | Discharge status: Against Medical Advice | Payer: Medicaid Other | Attending: Emergency Medicine | Admitting: Emergency Medicine

## 2019-08-24 ENCOUNTER — Emergency Department (HOSPITAL_COMMUNITY): Payer: Medicaid Other

## 2019-08-24 DIAGNOSIS — F17210 Nicotine dependence, cigarettes, uncomplicated: Secondary | ICD-10-CM | POA: Diagnosis not present

## 2019-08-24 DIAGNOSIS — R0789 Other chest pain: Secondary | ICD-10-CM | POA: Diagnosis present

## 2019-08-24 DIAGNOSIS — Z7982 Long term (current) use of aspirin: Secondary | ICD-10-CM | POA: Insufficient documentation

## 2019-08-24 DIAGNOSIS — Z5321 Procedure and treatment not carried out due to patient leaving prior to being seen by health care provider: Secondary | ICD-10-CM | POA: Diagnosis not present

## 2019-08-24 DIAGNOSIS — R109 Unspecified abdominal pain: Secondary | ICD-10-CM | POA: Insufficient documentation

## 2019-08-24 DIAGNOSIS — R0602 Shortness of breath: Secondary | ICD-10-CM | POA: Diagnosis not present

## 2019-08-24 DIAGNOSIS — R079 Chest pain, unspecified: Secondary | ICD-10-CM | POA: Diagnosis not present

## 2019-08-24 DIAGNOSIS — Z79899 Other long term (current) drug therapy: Secondary | ICD-10-CM | POA: Insufficient documentation

## 2019-08-24 DIAGNOSIS — K219 Gastro-esophageal reflux disease without esophagitis: Secondary | ICD-10-CM | POA: Insufficient documentation

## 2019-08-24 DIAGNOSIS — Z20828 Contact with and (suspected) exposure to other viral communicable diseases: Secondary | ICD-10-CM | POA: Insufficient documentation

## 2019-08-24 DIAGNOSIS — R112 Nausea with vomiting, unspecified: Secondary | ICD-10-CM | POA: Diagnosis not present

## 2019-08-24 LAB — URINALYSIS, ROUTINE W REFLEX MICROSCOPIC
Bilirubin Urine: NEGATIVE
Glucose, UA: NEGATIVE mg/dL
Hgb urine dipstick: NEGATIVE
Ketones, ur: 20 mg/dL — AB
Nitrite: NEGATIVE
Protein, ur: 100 mg/dL — AB
Specific Gravity, Urine: 1.043 — ABNORMAL HIGH (ref 1.005–1.030)
pH: 6 (ref 5.0–8.0)

## 2019-08-24 LAB — CBC
HCT: 43.1 % (ref 36.0–46.0)
Hemoglobin: 14.1 g/dL (ref 12.0–15.0)
MCH: 30.1 pg (ref 26.0–34.0)
MCHC: 32.7 g/dL (ref 30.0–36.0)
MCV: 91.9 fL (ref 80.0–100.0)
Platelets: 227 10*3/uL (ref 150–400)
RBC: 4.69 MIL/uL (ref 3.87–5.11)
RDW: 15.5 % (ref 11.5–15.5)
WBC: 7 10*3/uL (ref 4.0–10.5)
nRBC: 0 % (ref 0.0–0.2)

## 2019-08-24 LAB — BASIC METABOLIC PANEL WITH GFR
Anion gap: 11 (ref 5–15)
BUN: 7 mg/dL (ref 6–20)
CO2: 25 mmol/L (ref 22–32)
Calcium: 9.3 mg/dL (ref 8.9–10.3)
Chloride: 103 mmol/L (ref 98–111)
Creatinine, Ser: 0.79 mg/dL (ref 0.44–1.00)
GFR calc Af Amer: >60 mL/min (ref >60)
GFR calc non Af Amer: >60 mL/min (ref >60)
Glucose, Bld: 124 mg/dL — ABNORMAL HIGH (ref 70–99)
Potassium: 4 mmol/L (ref 3.5–5.1)
Sodium: 139 mmol/L (ref 135–145)

## 2019-08-24 LAB — PROTIME-INR
INR: 1 (ref 0.8–1.2)
Prothrombin Time: 12.6 s (ref 11.4–15.2)

## 2019-08-24 LAB — I-STAT BETA HCG BLOOD, ED (MC, WL, AP ONLY): I-stat hCG, quantitative: <5 m[IU]/mL (ref <5)

## 2019-08-24 LAB — TROPONIN I (HIGH SENSITIVITY): Troponin I (High Sensitivity): 3 ng/L (ref <18)

## 2019-08-24 MED ORDER — LIDOCAINE VISCOUS HCL 2 % MT SOLN
15.0000 mL | Freq: Once | OROMUCOSAL | Status: AC
  Administered 2019-08-24: 09:00:00 15 mL via ORAL

## 2019-08-24 MED ORDER — ONDANSETRON HCL 4 MG/2ML IJ SOLN: 4.0000 mg | Freq: Once | INTRAMUSCULAR | Status: DC

## 2019-08-24 MED ORDER — ALUM & MAG HYDROXIDE-SIMETH 200-200-20 MG/5ML PO SUSP
30.0000 mL | Freq: Once | ORAL | Status: AC
  Administered 2019-08-24: 30 mL via ORAL

## 2019-08-24 MED ORDER — SUCRALFATE 1 G PO TABS: 1.0000 g | ORAL_TABLET | Freq: Four times a day (QID) | ORAL | 0 refills | Status: DC | PRN

## 2019-08-24 MED ORDER — SODIUM CHLORIDE 0.9% FLUSH: 3.0000 mL | Freq: Once | INTRAVENOUS | Status: DC

## 2019-08-24 MED ORDER — ALUM & MAG HYDROXIDE-SIMETH 200-200-20 MG/5ML PO SUSP
ORAL | Status: AC
  Filled 2019-08-24: qty 30

## 2019-08-24 MED ORDER — OMEPRAZOLE 20 MG PO CPDR: 20.0000 mg | DELAYED_RELEASE_CAPSULE | Freq: Every day | ORAL | 1 refills | Status: DC

## 2019-08-24 MED ORDER — ONDANSETRON 4 MG PO TBDP
ORAL_TABLET | ORAL | Status: AC
  Filled 2019-08-24: qty 1

## 2019-08-24 MED ORDER — ONDANSETRON 4 MG PO TBDP
4.0000 mg | ORAL_TABLET | Freq: Once | ORAL | Status: AC
  Administered 2019-08-24: 4 mg via ORAL

## 2019-08-24 MED ORDER — ONDANSETRON 4 MG PO TBDP: 4.0000 mg | ORAL_TABLET | Freq: Three times a day (TID) | ORAL | 0 refills | Status: DC | PRN

## 2019-08-24 MED ORDER — FAMOTIDINE 20 MG PO TABS: 20.0000 mg | ORAL_TABLET | Freq: Two times a day (BID) | ORAL | 0 refills | Status: DC

## 2019-08-24 MED ORDER — LIDOCAINE VISCOUS HCL 2 % MT SOLN
OROMUCOSAL | Status: AC
  Filled 2019-08-24: qty 15

## 2019-08-24 NOTE — ED Notes (Signed)
Pt has been called for vtials x3 no response.

## 2019-08-24 NOTE — ED Provider Notes (Signed)
[place]    CSN: [id number] Arrival date & time: 08/24/19  6644      History   Chief Complaint Chief Complaint  Patient presents with  . Abdominal Pain    HPI KELLSEY SANSONE is a 33 y.o. female.   Patient is a 33 year old female that presents today with epigastric discomfort, burning in chest x1 days.  Symptoms have been constant, waxing and waning.  She is been taking Tums and nausea pills and feels better at times with this medication.  Reports she got in the shower this morning and postop.  Patient went to the ER prior to coming here and had a full work-up to include blood work, EKG, pregnancy test which was all negative. He did not have any medicine in the ER.  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  She has been taking ibuprofen, Tylenol and smoking marijuana for her symptoms.  She has been sipping fluids but vomiting after eating meals.  Reporting approximately 2 weeks ago she has some bright red blood with bowel movement.  Denies any history of constipation.  No lower abdominal discomfort, fever, dysuria, hematuria or urinary frequency.  ROS per HPI    Abdominal Pain   Past Medical History:  Diagnosis Date  . HPV (human papilloma virus) infection     There are no active problems to display for this patient.   Past Surgical History:  Procedure Laterality Date  . DIAGNOSTIC LAPAROSCOPY WITH REMOVAL OF ECTOPIC PREGNANCY N/A 03/27/2016   Procedure: Diagnostic  Laparoscopy,  Left Salpingectomy with Removal Ectopic Pregnancy;  Surgeon: Osborne Oman, MD;  Location: [facility];  Service: Gynecology;  Laterality: N/A;  . WISDOM TOOTH EXTRACTION      OB History    Gravida  1   Para  0   Term  0   Preterm  0   AB  1   Living  0     SAB  0   TAB  0   Ectopic  1   Multiple  0   Live Births               Home Medications    Prior to Admission medications   Medication Sig Start Date End Date Taking?  Authorizing Provider  Aspirin-Acetaminophen-Caffeine (GOODYS EXTRA STRENGTH) [phone] MG PACK Take 2 packets by mouth daily as needed (HEADACHE).    [provider]  famotidine (PEPCID) 20 MG tablet Take 1 tablet (20 mg total) by mouth 2 (two) times daily. 08/24/19   Loura Halt A, NP  omeprazole (PRILOSEC) 20 MG capsule Take 1 capsule (20 mg total) by mouth daily. 08/24/19   Federico Maiorino, Tressia Miners A, NP  ondansetron (ZOFRAN ODT) 4 MG disintegrating tablet Take 1 tablet (4 mg total) by mouth every 8 (eight) hours as needed for nausea or vomiting. 08/24/19   Magali Bray, Tressia Miners A, NP  sucralfate (CARAFATE) 1 g tablet Take 1 tablet (1 g total) by mouth 4 (four) times daily as needed. 08/24/19   Loura Halt A, NP  metoCLOPramide (REGLAN) 10 MG tablet Take 1 tablet (10 mg total) by mouth every 6 (six) hours as needed for nausea (nausea/headache). 10/25/16 08/24/19  Street, Mercedes, PA-C  pantoprazole (PROTONIX) 20 MG tablet Take 1 tablet (20 mg total) by mouth daily. 10/02/18 08/24/19  Janeece Fitting, PA-C  ranitidine (ZANTAC) 150 MG tablet Take 1 tablet (150 mg total) by mouth 2 (two) times daily. 10/25/16 08/24/19  Street, Stephenson, PA-C  Family History Family History  Problem Relation Age of Onset  . Healthy Mother   . Seizures Father   . Heart failure Father     Social History Social History   Tobacco Use  . Smoking status: Current Every Day Smoker    Packs/day: 0.25    Types: Cigarettes  . Smokeless tobacco: Never Used  Substance Use Topics  . Alcohol use: Yes    Alcohol/week: 4.0 standard drinks    Types: 4 Cans of beer per week  . Drug use: Yes    Types: Marijuana    Comment: no longer since she found out she was pregnant     Allergies   Patient has no known allergies.   Review of Systems Review of Systems  Gastrointestinal: Positive for abdominal pain.     Physical Exam Triage Vital Signs ED Triage Vitals  Enc Vitals Group     BP 08/24/19 0847 118/78     Pulse Rate  08/24/19 0847 89     Resp 08/24/19 0847 20     Temp 08/24/19 0847 97.9 F (36.6 C)     Temp Source 08/24/19 0847 Temporal     SpO2 08/24/19 0847 100 %     Weight 08/24/19 0845 180 lb (81.6 kg)     Height --      Head Circumference --      Peak Flow --      Pain Score 08/24/19 0845 10     Pain Loc --      Pain Edu? --      Excl. in GC? --    No data found.  Updated Vital Signs BP 118/78 (BP Location: Right Arm)   Pulse 89   Temp 97.9 F (36.6 C) (Temporal)   Resp 20 Comment: patient crying  Wt 180 lb (81.6 kg)   LMP 08/15/2019   SpO2 100%   BMI 29.05 kg/m   Visual Acuity Right Eye Distance:   Left Eye Distance:   Bilateral Distance:    Right Eye Near:   Left Eye Near:    Bilateral Near:     Physical Exam Vitals signs and nursing note reviewed.  Constitutional:      General: She is not in acute distress.    Appearance: She is not ill-appearing, toxic-appearing or diaphoretic.     Comments: Appears in pain   HENT:     Head: Normocephalic and atraumatic.     Nose: Nose normal.  Eyes:     Conjunctiva/sclera: Conjunctivae normal.  Neck:     Musculoskeletal: Normal range of motion.  Cardiovascular:     Rate and Rhythm: Normal rate and regular rhythm.     Pulses: Normal pulses.     Heart sounds: Normal heart sounds.  Pulmonary:     Effort: Pulmonary effort is normal.     Breath sounds: Normal breath sounds.  Abdominal:     Palpations: Abdomen is soft.     Tenderness: There is abdominal tenderness in the epigastric area.  Musculoskeletal: Normal range of motion.  Skin:    General: Skin is warm and dry.  Neurological:     Mental Status: She is alert.  Psychiatric:        Mood and Affect: Mood normal.      UC Treatments / Results  Labs (all labs ordered are listed, but only abnormal results are displayed) Labs Reviewed  NOVEL CORONAVIRUS, NAA (HOSP ORDER, SEND-OUT TO REF LAB; TAT 18-24 HRS)    EKG  Radiology Dg Chest 2 View  Result Date:  08/24/2019 CLINICAL DATA:  Chest pain.  Shortness of breath. EXAM: CHEST - 2 VIEW COMPARISON:  None. FINDINGS: The heart size and mediastinal contours are within normal limits. Both lungs are clear. The visualized skeletal structures are unremarkable. IMPRESSION: Negative two view chest x-ray Electronically Signed   By: Marin Robertshristopher  Mattern [name]   On: 08/24/2019 07:12    Procedures Procedures (including critical care time)  Medications Ordered in UC Medications  alum & mag hydroxide-simeth (MAALOX/MYLANTA) 200-200-20 MG/5ML suspension 30 mL (30 mLs Oral Given 08/24/19 0916)    And  lidocaine (XYLOCAINE) 2 % viscous mouth solution 15 mL (15 mLs Oral Given 08/24/19 0916)  ondansetron (ZOFRAN-ODT) disintegrating tablet 4 mg (4 mg Oral Given 08/24/19 0930)  ondansetron (ZOFRAN-ODT) 4 MG disintegrating tablet (has no administration in time range)  alum & mag hydroxide-simeth (MAALOX/MYLANTA) 200-200-20 MG/5ML suspension (has no administration in time range)  lidocaine (XYLOCAINE) 2 % viscous mouth solution (has no administration in time range)    Initial Impression / Assessment and Plan / UC Course  I have reviewed the triage vital signs and the nursing notes.  Pertinent labs & imaging results that were available during my care of the patient were reviewed by me and considered in my medical decision making (see chart for details).     33 year old female with epigastric discomfort and burning in chest. Symptoms and exam consistent with GERD. Patient given Zofran and GI cocktail here in clinic.  She did have relief with this medication. Did have one episode of vomiting while she was here but felt better. No blood in vomit. Full work-up in the ER prior to coming here with all negative results. We will have her start on omeprazole daily and use famotidine as needed. She can also use the Carafate 4 times a day as needed she has had this in the past. Contact given Recommend not taking any more  NSAIDs and stopping the marijuana use. Also recommended if symptoms continue she will need to see a GI specialist for possible upper endoscopy. Patient understanding and agree. Information printed out on GERD diet Final Clinical Impressions(s) / UC Diagnoses   Final diagnoses:  Gastroesophageal reflux disease without esophagitis     Discharge Instructions     I believe that your symptoms are related to acid reflux Start taking the omeprazole daily You can take the Zofran and the Pepcid as needed Carafate 4 times a day as needed I recommend not taking anymore ibuprofen, Aleve or any other NSAIDs.  This could cause worsening symptoms. I also suggest you stop smoking marijuana Your symptoms continue or worsen you need to follow-up with GI specialist. Otherwise avoid spicy, greasy foods, milk products, caffeine and chocolate. We printed some information out for you to read about acid reflux and things to avoid     ED Prescriptions    Medication Sig Dispense Auth. Provider   omeprazole (PRILOSEC) 20 MG capsule Take 1 capsule (20 mg total) by mouth daily. 30 capsule Dorsel Flinn A, NP   ondansetron (ZOFRAN ODT) 4 MG disintegrating tablet Take 1 tablet (4 mg total) by mouth every 8 (eight) hours as needed for nausea or vomiting. 20 tablet Randal Goens A, NP   famotidine (PEPCID) 20 MG tablet Take 1 tablet (20 mg total) by mouth 2 (two) times daily. 30 tablet Keiasha Diep A, NP   sucralfate (CARAFATE) 1 g tablet Take 1 tablet (1 g total) by mouth 4 (four) times  daily as needed. 30 tablet Dahlia Byes A, NP     PDMP not reviewed this encounter.   Janace Aris, NP 08/24/19 1408

## 2019-08-24 NOTE — ED Triage Notes (Signed)
Patient presents with multiple complaints: Central chest pain with emesis and SOB this morning , brief syncopal episode this morning and fatigue  , denies fever or cough. Marland Kitchen

## 2019-08-24 NOTE — ED Triage Notes (Addendum)
Pt states she has burning and aching pain x 4 days. Pt states she has tried tums and  nausea pills and states she feels better when she lay in the shower with the water running. Pt states she passed out this morning at home. Pt states she's not sure how long she was out.

## 2019-08-24 NOTE — Discharge Instructions (Addendum)
I believe that your symptoms are related to acid reflux Start taking the omeprazole daily You can take the Zofran and the Pepcid as needed Carafate 4 times a day as needed I recommend not taking anymore ibuprofen, Aleve or any other NSAIDs.  This could cause worsening symptoms. I also suggest you stop smoking marijuana Your symptoms continue or worsen you need to follow-up with GI specialist. Otherwise avoid spicy, greasy foods, milk products, caffeine and chocolate. We printed some information out for you to read about acid reflux and things to avoid

## 2019-08-26 LAB — NOVEL CORONAVIRUS, NAA (HOSP ORDER, SEND-OUT TO REF LAB; TAT 18-24 HRS): SARS-CoV-2, NAA: NOT DETECTED

## 2019-09-27 ENCOUNTER — Ambulatory Visit: Payer: Medicaid Other | Admitting: Gastroenterology

## 2019-10-17 ENCOUNTER — Ambulatory Visit: Payer: Medicaid Other | Admitting: Physician Assistant

## 2019-10-17 ENCOUNTER — Encounter: Payer: Self-pay | Admitting: Physician Assistant

## 2019-10-17 VITALS — BP 124/64 | HR 105 | Temp 97.3°F | Ht 66.0 in | Wt 192.6 lb

## 2019-10-17 DIAGNOSIS — K5900 Constipation, unspecified: Secondary | ICD-10-CM

## 2019-10-17 DIAGNOSIS — K219 Gastro-esophageal reflux disease without esophagitis: Secondary | ICD-10-CM

## 2019-10-17 DIAGNOSIS — R112 Nausea with vomiting, unspecified: Secondary | ICD-10-CM

## 2019-10-17 DIAGNOSIS — R1013 Epigastric pain: Secondary | ICD-10-CM | POA: Diagnosis not present

## 2019-10-17 DIAGNOSIS — Z1159 Encounter for screening for other viral diseases: Secondary | ICD-10-CM

## 2019-10-17 MED ORDER — FAMOTIDINE 20 MG PO TABS: 20.0000 mg | ORAL_TABLET | Freq: Two times a day (BID) | ORAL | 5 refills | Status: DC

## 2019-10-17 MED ORDER — OMEPRAZOLE 20 MG PO CPDR: 20.0000 mg | DELAYED_RELEASE_CAPSULE | Freq: Every day | ORAL | 5 refills | Status: DC

## 2019-10-17 MED ORDER — SUCRALFATE 1 G PO TABS: 1.0000 g | ORAL_TABLET | Freq: Four times a day (QID) | ORAL | 0 refills | Status: DC

## 2019-10-17 NOTE — Progress Notes (Signed)
Chief Complaint: Nausea and vomiting, GERD, constipation  HPI:    [name] is a 34 year old African-American female with a past medical history as listed below, who presents to clinic today to discuss her nausea and vomiting, reflux and constipation.    Per chart review patient has been to the ED multiple times for non-intractable vomiting thought related to marijuana use.    08/24/2019 is her most recent urgent care visit.  At that time described epigastric discomfort and burning in her chest x1 day.  She had been to the ER prior to coming to the urgent care and had a full work-up including blood work, EKG, pregnancy test which was all negative.  She reported vomiting after eating any food for the past 2 weeks.  Had also had constipation.  At that time, improved with Zofran and a GI cocktail.  She was started on Omeprazole and Famotidine as well as continued on Carafate to 4 times daily.    Today, the patient tells me that she has been doing well using Omeprazole 20 mg daily, Famotidine 40 mg twice daily and Carafate 2-3 times a day "when I can fit it in".  Explains that she has had only minor epigastric pain and only a little bit of nausea since using these medications.  Prior to this describes symptoms which have been going on for at least a year and a 1/2 to 2 years.  Explains that previously this was blamed on her marijuana use.  Tells me she has been smoking marijuana since 34 years old.  She is trying to cut back and is currently down to 2 blunts per day.  Also describes heavy alcohol use which she has completely abstained from over the past month or so.  Does also tell me that she uses Goody powders 2 packets typically a couple times a week for headaches etc.  Tells me that typically she will go for a while with symptoms off and on and then have a week or 2 weeks straight where she is so nauseous that the only thing that helps is to sit in a hot bathtub/warm shower.    Also describes it recently  she is changed towards constipation noting a bowel movement once every 3 days and that is "if I strain really hard".  Apparently used an enema which "I hate and will never use again".  Which did result in stool.    Denies fever, chills or weight loss.  Past Medical History:  Diagnosis Date  . HPV (human papilloma virus) infection     Past Surgical History:  Procedure Laterality Date  . DIAGNOSTIC LAPAROSCOPY WITH REMOVAL OF ECTOPIC PREGNANCY N/A 03/27/2016   Procedure: Diagnostic  Laparoscopy,  Left Salpingectomy with Removal Ectopic Pregnancy;  Surgeon: Osborne Oman, MD;  Location: [facility];  Service: Gynecology;  Laterality: N/A;  . WISDOM TOOTH EXTRACTION      Current Outpatient Medications  Medication Sig Dispense Refill  . sucralfate (CARAFATE) 1 g tablet Take 1 tablet (1 g total) by mouth 4 (four) times daily as needed. 30 tablet 0  . Aspirin-Acetaminophen-Caffeine (GOODYS EXTRA STRENGTH) 500-325-65 MG PACK Take 2 packets by mouth daily as needed (HEADACHE).    . famotidine (PEPCID) 20 MG tablet Take 1 tablet (20 mg total) by mouth 2 (two) times daily. (Patient not taking: Reported on 10/17/2019) 30 tablet 0  . omeprazole (PRILOSEC) 20 MG capsule Take 1 capsule (20 mg total) by mouth daily. (Patient not taking: Reported on 10/17/2019)  30 capsule 1  . ondansetron (ZOFRAN ODT) 4 MG disintegrating tablet Take 1 tablet (4 mg total) by mouth every 8 (eight) hours as needed for nausea or vomiting. (Patient not taking: Reported on 10/17/2019) 20 tablet 0   No current facility-administered medications for this visit.    Allergies as of 10/17/2019  . (No Known Allergies)    Family History  Problem Relation Age of Onset  . Healthy Mother   . Seizures Father   . Heart failure Father   . Breast cancer Sister   . Diabetes Sister   . Diabetes Sister     Social History   Socioeconomic History  . Marital status: Married    Spouse name: Not on file  . Number of children: Not on file  .  Years of education: Not on file  . Highest education level: Not on file  Occupational History  . Not on file  Tobacco Use  . Smoking status: Current Every Day Smoker    Packs/day: 0.25    Types: Cigarettes  . Smokeless tobacco: Never Used  Substance and Sexual Activity  . Alcohol use: Yes    Alcohol/week: 8.0 standard drinks    Types: 4 Glasses of wine, 4 Shots of liquor per week  . Drug use: Yes    Types: Marijuana    Comment: no longer since she found out she was pregnant  . Sexual activity: Yes    Birth control/protection: None  Other Topics Concern  . Not on file  Social History Narrative  . Not on file   Social Determinants of Health   Financial Resource Strain:   . Difficulty of Paying Living Expenses: Not on file  Food Insecurity:   . Worried About Programme researcher, broadcasting/film/video in the Last Year: Not on file  . Ran Out of Food in the Last Year: Not on file  Transportation Needs:   . Lack of Transportation (Medical): Not on file  . Lack of Transportation (Non-Medical): Not on file  Physical Activity:   . Days of Exercise per Week: Not on file  . Minutes of Exercise per Session: Not on file  Stress:   . Feeling of Stress : Not on file  Social Connections:   . Frequency of Communication with Friends and Family: Not on file  . Frequency of Social Gatherings with Friends and Family: Not on file  . Attends Religious Services: Not on file  . Active Member of Clubs or Organizations: Not on file  . Attends Banker Meetings: Not on file  . Marital Status: Not on file  Intimate Partner Violence:   . Fear of Current or Ex-Partner: Not on file  . Emotionally Abused: Not on file  . Physically Abused: Not on file  . Sexually Abused: Not on file    Review of Systems:    Constitutional: No weight loss, fever or chills Skin: No rash  Cardiovascular: No chest pain Respiratory: No SOB  Gastrointestinal: See HPI and otherwise negative Genitourinary: No dysuria    Neurological: No headache Musculoskeletal: No new muscle or joint pain Hematologic: No  bruising Psychiatric: No history of depression or anxiety   Physical Exam:  Vital signs: BP 124/64   Pulse (!) 105   Temp (!) 97.3 F (36.3 C)   Ht 5\' 6"  (1.676 m)   Wt 192 lb 9.6 oz (87.4 kg)   BMI 31.09 kg/m   Constitutional:   Pleasant overweight AA female appears to be in NAD, Well  developed, Well nourished, alert and cooperative Head:  Normocephalic and atraumatic. Eyes:   PEERL, EOMI. No icterus. Conjunctiva pink. Ears:  Normal auditory acuity. Neck:  Supple Throat: Oral cavity and pharynx without inflammation, swelling or lesion.  Respiratory: Respirations even and unlabored. Lungs clear to auscultation bilaterally.   No wheezes, crackles, or rhonchi.  Cardiovascular: Normal S1, S2. No MRG. Regular rate and rhythm. No peripheral edema, cyanosis or pallor.  Gastrointestinal:  Soft, nondistended, moderate epigastric ttp. No rebound or guarding. Normal bowel sounds. No appreciable masses or hepatomegaly. Rectal:  Not performed.  Msk:  Symmetrical without gross deformities. Without edema, no deformity or joint abnormality.  Neurologic:  Alert and  oriented x4;  grossly normal neurologically.  Skin:   Dry and intact without significant lesions or rashes. Psychiatric: Demonstrates good judgement and reason without abnormal affect or behaviors.  RELEVANT LABS AND IMAGING: CBC    Component Value Date/Time   WBC 7.0 08/24/2019 0639   RBC 4.69 08/24/2019 0639   HGB 14.1 08/24/2019 0639   HCT 43.1 08/24/2019 0639   PLT 227 08/24/2019 0639   MCV 91.9 08/24/2019 0639   MCH 30.1 08/24/2019 0639   MCHC 32.7 08/24/2019 0639   RDW 15.5 08/24/2019 0639   LYMPHSABS 2.2 08/18/2019 1943   MONOABS 1.0 08/18/2019 1943   EOSABS 0.2 08/18/2019 1943   BASOSABS 0.0 08/18/2019 1943    CMP     Component Value Date/Time   NA 139 08/24/2019 0639   K 4.0 08/24/2019 0639   CL 103 08/24/2019 0639    CO2 25 08/24/2019 0639   GLUCOSE 124 (H) 08/24/2019 0639   BUN 7 08/24/2019 0639   CREATININE 0.79 08/24/2019 0639   CALCIUM 9.3 08/24/2019 0639   PROT 6.9 08/18/2019 1943   ALBUMIN 3.9 08/18/2019 1943   AST 18 08/18/2019 1943   ALT 17 08/18/2019 1943   ALKPHOS 45 08/18/2019 1943   BILITOT 0.6 08/18/2019 1943   GFRNONAA >60 08/24/2019 0639   GFRAA >60 08/24/2019 0639    Assessment: 1.  Epigastric pain: Described as burning, better with antacids; likely related to reflux/gastritis possibly from hyperemesis 2.  GERD 3.  Nausea and vomiting: Can go 1 to 2 weeks with constant symptoms only better with a hot bath, does use chronic marijuana; likely related to marijuana use/gastritis 4.  Constipation: This is a change recently, possibly related to new medications  Plan: 1.  Given severity of symptoms and that they have been going on for 2 years would recommend patient have an EGD for further evaluation.  Did discuss risks, benefits, limitations and alternatives and patient agrees to proceed.  This is scheduled with Dr. Barron Alvine in the [facility].  Patient will be Covid tested 2 days prior to time of exam. 2.  Continue Famotidine 40 mg p.o. twice daily, every morning and nightly #60 with 5 refills 3.  Continue Omeprazole 20 mg p.o. daily #30 with 5 refills 4.  Continue Carafate 1 g 4 times daily, 1 hour before 2 hours after eating other medications.  Did discuss that if timing is hard for her she can use it as often as is possible. 5.  Reviewed antireflux diet and lifestyle modifications.  This included the fact the patient should avoid Goody powders and other NSAIDs.  Would prefer she use Tylenol. 6.  Discussed marijuana use, likely this is the cause of her nausea/vomiting when it is so severe as the only help to her is a hot shower/bath which is very typical  of hyperemesis, encouraged her to continue to decrease/eventually abstain from marijuana use. 7.  Patient will start MiraLAX daily to help  with constipation. 8.  Patient to follow in clinic per recommendations from Dr. Barron Alvine after time of procedure.  Hyacinth Meeker, PA-C [facility] 10/17/2019, 2:53 PM

## 2019-10-17 NOTE — Progress Notes (Signed)
Agree with the assessment and plan as outlined by Jennifer Lemmon, PA-C. ? ?Makaylyn Sinyard, DO, FACG ? ?

## 2019-10-17 NOTE — Patient Instructions (Addendum)
If you are age 32 or older, your body mass index should be between 23-30. Your Body mass index is 31.09 kg/m. If this is out of the aforementioned range listed, please consider follow up with your Primary Care Provider.  If you are age 44 or younger, your body mass index should be between 19-25. Your Body mass index is 31.09 kg/m. If this is out of the aformentioned range listed, please consider follow up with your Primary Care Provider.   You have been scheduled for an endoscopy. Please follow written instructions given to you at your visit today. If you use inhalers (even only as needed), please bring them with you on the day of your procedure.  Please start using Miralax daily.  We have sent the following medications to your pharmacy for you to pick up at your convenience:  We have sent refills to your pharmacy for the following medications. Famotidine 40 mg twice daily Omeprazole 20 mg  Daily Carafate  Due to recent changes in healthcare laws, you may see the results of your imaging and laboratory studies on MyChart before your provider has had a chance to review them.  We understand that in some cases there may be results that are confusing or concerning to you. Not all laboratory results come back in the same time frame and the provider may be waiting for multiple results in order to interpret others.  Please give [place] 48 hours in order for your provider to thoroughly review all the results before contacting the office for clarification of your results.   Thank you for choosing me and [facility]

## 2019-10-18 ENCOUNTER — Ambulatory Visit (INDEPENDENT_AMBULATORY_CARE_PROVIDER_SITE_OTHER): Payer: Medicaid Other

## 2019-10-18 DIAGNOSIS — Z1159 Encounter for screening for other viral diseases: Secondary | ICD-10-CM | POA: Diagnosis not present

## 2019-10-19 LAB — SARS CORONAVIRUS 2 (TAT 6-24 HRS): SARS Coronavirus 2: NEGATIVE

## 2019-10-20 ENCOUNTER — Encounter: Payer: Self-pay | Admitting: Gastroenterology

## 2019-10-20 ENCOUNTER — Other Ambulatory Visit: Payer: Self-pay

## 2019-10-20 ENCOUNTER — Ambulatory Visit (AMBULATORY_SURGERY_CENTER): Payer: Medicaid Other | Admitting: Gastroenterology

## 2019-10-20 ENCOUNTER — Other Ambulatory Visit (INDEPENDENT_AMBULATORY_CARE_PROVIDER_SITE_OTHER): Payer: Medicaid Other

## 2019-10-20 VITALS — BP 118/64 | HR 86 | Temp 98.4°F | Resp 35 | Wt 192.0 lb

## 2019-10-20 DIAGNOSIS — K3189 Other diseases of stomach and duodenum: Secondary | ICD-10-CM | POA: Diagnosis not present

## 2019-10-20 DIAGNOSIS — R1013 Epigastric pain: Secondary | ICD-10-CM

## 2019-10-20 DIAGNOSIS — K2950 Unspecified chronic gastritis without bleeding: Secondary | ICD-10-CM

## 2019-10-20 DIAGNOSIS — K2100 Gastro-esophageal reflux disease with esophagitis, without bleeding: Secondary | ICD-10-CM | POA: Diagnosis not present

## 2019-10-20 DIAGNOSIS — R112 Nausea with vomiting, unspecified: Secondary | ICD-10-CM | POA: Diagnosis not present

## 2019-10-20 DIAGNOSIS — K259 Gastric ulcer, unspecified as acute or chronic, without hemorrhage or perforation: Secondary | ICD-10-CM

## 2019-10-20 LAB — HCG, QUANTITATIVE, PREGNANCY: Quantitative HCG: <0.6 m[IU]/mL

## 2019-10-20 MED ORDER — SODIUM CHLORIDE 0.9 % IV SOLN: 500.0000 mL | Freq: Once | INTRAVENOUS | Status: DC

## 2019-10-20 NOTE — Op Note (Signed)
[facility] Patient Name: [name] Procedure Date: 10/20/2019 9:45 AM MRN: [id number] Endoscopist: Doristine Locks , MD Age: 34 Referring MD:  Date of Birth: 1985/10/18 Gender: Female Account #: [id number] Procedure:                Upper GI endoscopy Indications:              Epigastric abdominal pain, Nausea with vomiting                           34 yo female with intermittent MEG pain and n/v for                            1.5-2 years. Recently started on Omeprazole 20 mg                            daily, Famotidine 40 mg twice daily and Carafate                            2-3 times a day, with clinical improvement. Medicines:                Monitored Anesthesia Care Procedure:                Pre-Anesthesia Assessment:                           - Prior to the procedure, a History and Physical                            was performed, and patient medications and                            allergies were reviewed. The patient's tolerance of                            previous anesthesia was also reviewed. The risks                            and benefits of the procedure and the sedation                            options and risks were discussed with the patient.                            All questions were answered, and informed consent                            was obtained. Prior Anticoagulants: The patient has                            taken no previous anticoagulant or antiplatelet                            agents. ASA Grade Assessment: II - A patient with  mild systemic disease. After reviewing the risks                            and benefits, the patient was deemed in                            satisfactory condition to undergo the procedure.                           After obtaining informed consent, the endoscope was                            passed under direct vision. Throughout the                            procedure, the  patient's blood pressure, pulse, and                            oxygen saturations were monitored continuously. The                            Endoscope was introduced through the mouth, and                            advanced to the second part of duodenum. The upper                            GI endoscopy was accomplished without difficulty.                            The patient tolerated the procedure well. Scope In: Scope Out: Findings:                 LA Grade A (one or more mucosal breaks less than 5                            mm, not extending between tops of 2 mucosal folds)                            esophagitis with no bleeding was found 37 cm from                            the incisors.                           The upper third of the esophagus and middle third                            of the esophagus were normal.                           The gastroesophageal flap valve was visualized  endoscopically and classified as Hill Grade II                            (fold present, opens with respiration).                           A single erosion with no bleeding and no stigmata                            of recent bleeding was found in the gastric antrum.                            Biopsies were taken with a cold forceps for                            Helicobacter pylori testing. Estimated blood loss                            was minimal.                           The gastric fundus, gastric body, incisura and                            remainder of the gastric antrum were normal.                            Biopsies were taken with a cold forceps for                            Helicobacter pylori testing. Estimated blood loss                            was minimal.                           The duodenal bulb, first portion of the duodenum                            and second portion of the duodenum were normal. Complications:            No immediate  complications. Estimated Blood Loss:     Estimated blood loss was minimal. Impression:               - LA Grade A esophagitis with no bleeding.                           - Normal upper third of esophagus and middle third                            of esophagus.                           - Gastroesophageal flap valve classified as Hill  Grade II (fold present, opens with respiration).                           - Gastric erosion with no bleeding and no stigmata                            of recent bleeding. Biopsied.                           - Normal gastric fundus, gastric body, incisura and                            antrum. Biopsied.                           - Normal duodenal bulb, first portion of the                            duodenum and second portion of the duodenum. Recommendation:           - Patient has a contact number available for                            emergencies. The signs and symptoms of potential                            delayed complications were discussed with the                            patient. Return to normal activities tomorrow.                            Written discharge instructions were provided to the                            patient.                           - Resume previous diet.                           - Continue present medications.                           - Await pathology results.                           - Avoid aspirin, ibuprofen, naproxen, or other                            non-steroidal anti-inflammatory drugs (NSAIDs).                           - Avoid marijuana.                           - Return to GI clinic PRN.  Gerrit Heck, MD 10/20/2019 10:03:13 AM

## 2019-10-20 NOTE — Progress Notes (Signed)
K.A. vital signs. J.B. temps. 

## 2019-10-20 NOTE — Progress Notes (Signed)
Report given to PACU, vss 

## 2019-10-20 NOTE — Progress Notes (Signed)
Pt's states no medical or surgical changes since previsit or office visit. 

## 2019-10-20 NOTE — Patient Instructions (Signed)
AVOID ASPIRIN,IBUPROFEN,NAPROXEN, OR OTHER NON-STEROIDAL ANTI INFLAMMATORY MEDICATIONS   AVOID MARIJUANA    Handouts on esophagitis and gastritis given to you today  Resume usual diet and medications    YOU HAD AN ENDOSCOPIC PROCEDURE TODAY AT THE [facility]:   Refer to the procedure report that was given to you for any specific questions about what was found during the examination.  If the procedure report does not answer your questions, please call your gastroenterologist to clarify.  If you requested that your care partner not be given the details of your procedure findings, then the procedure report has been included in a sealed envelope for you to review at your convenience later.  YOU SHOULD EXPECT: Some feelings of bloating in the abdomen. Passage of more gas than usual.  Walking can help get rid of the air that was put into your GI tract during the procedure and reduce the bloating. If you had a lower endoscopy (such as a colonoscopy or flexible sigmoidoscopy) you may notice spotting of blood in your stool or on the toilet paper. If you underwent a bowel prep for your procedure, you may not have a normal bowel movement for a few days.  Please Note:  You might notice some irritation and congestion in your nose or some drainage.  This is from the oxygen used during your procedure.  There is no need for concern and it should clear up in a day or so.  SYMPTOMS TO REPORT IMMEDIATELY:     Following upper endoscopy (EGD)  Vomiting of blood or coffee ground material  New chest pain or pain under the shoulder blades  Painful or persistently difficult swallowing  New shortness of breath  Fever of 100F or higher  Black, tarry-looking stools  For urgent or emergent issues, a gastroenterologist can be reached at any hour by calling [phone].   DIET:  We do recommend a small meal at first, but then you may proceed to your regular diet.  Drink plenty of fluids but you  should avoid alcoholic beverages for 24 hours.  ACTIVITY:  You should plan to take it easy for the rest of today and you should NOT DRIVE or use heavy machinery until tomorrow (because of the sedation medicines used during the test).    FOLLOW UP: Our staff will call the number listed on your records 48-72 hours following your procedure to check on you and address any questions or concerns that you may have regarding the information given to you following your procedure. If we do not reach you, we will leave a message.  We will attempt to reach you two times.  During this call, we will ask if you have developed any symptoms of COVID 19. If you develop any symptoms (ie: fever, flu-like symptoms, shortness of breath, cough etc.) before then, please call [phone].  If you test positive for Covid 19 in the 2 weeks post procedure, please call and report this information to [place].    If any biopsies were taken you will be contacted by phone or by letter within the next 1-3 weeks.  Please call us at [phone] if you have not heard about the biopsies in 3 weeks.    SIGNATURES/CONFIDENTIALITY: You and/or your care partner have signed paperwork which will be entered into your electronic medical record.  These signatures attest to the fact that that the information above on your After Visit Summary has been reviewed and is understood.  Full responsibility  of the confidentiality of this discharge information lies with you and/or your care-partner.

## 2019-10-20 NOTE — Progress Notes (Signed)
Called to room to assist during endoscopic procedure.  Patient ID and intended procedure confirmed with present staff. Received instructions for my participation in the procedure from the performing physician.  

## 2019-10-24 ENCOUNTER — Telehealth: Payer: Self-pay | Admitting: *Deleted

## 2019-10-24 NOTE — Telephone Encounter (Signed)
Left message on follow up call. 

## 2019-10-24 NOTE — Telephone Encounter (Signed)
  Follow up Call-  Call back number 10/20/2019  Post procedure Call Back phone  # [phone]  Permission to leave phone message Yes  Some recent data might be hidden     Patient questions:  Message left to call us if necessary.  Second call.,

## 2019-10-25 ENCOUNTER — Encounter: Payer: Self-pay | Admitting: Gastroenterology

## 2020-09-04 ENCOUNTER — Encounter (HOSPITAL_COMMUNITY): Payer: Self-pay

## 2020-09-04 ENCOUNTER — Emergency Department (HOSPITAL_COMMUNITY)
Admission: EM | Admit: 2020-09-04 | Discharge: 2020-09-04 | Disposition: A | Discharge status: Home / Self Care | Payer: Medicaid Other | Attending: Emergency Medicine | Admitting: Emergency Medicine

## 2020-09-04 ENCOUNTER — Emergency Department (HOSPITAL_COMMUNITY): Payer: Medicaid Other

## 2020-09-04 ENCOUNTER — Ambulatory Visit (HOSPITAL_COMMUNITY)
Admission: EM | Admit: 2020-09-04 | Discharge: 2020-09-04 | Disposition: A | Discharge status: Home / Self Care | Payer: Medicaid Other | Attending: Family Medicine | Admitting: Family Medicine

## 2020-09-04 ENCOUNTER — Emergency Department (HOSPITAL_COMMUNITY)
Admission: EM | Admit: 2020-09-04 | Discharge: 2020-09-04 | Disposition: A | Discharge status: Home / Self Care | Payer: Medicaid Other | Source: Home / Self Care | Attending: Emergency Medicine | Admitting: Emergency Medicine

## 2020-09-04 ENCOUNTER — Ambulatory Visit (INDEPENDENT_AMBULATORY_CARE_PROVIDER_SITE_OTHER): Payer: Medicaid Other

## 2020-09-04 ENCOUNTER — Other Ambulatory Visit: Payer: Self-pay

## 2020-09-04 DIAGNOSIS — R109 Unspecified abdominal pain: Secondary | ICD-10-CM | POA: Diagnosis not present

## 2020-09-04 DIAGNOSIS — N939 Abnormal uterine and vaginal bleeding, unspecified: Secondary | ICD-10-CM | POA: Insufficient documentation

## 2020-09-04 DIAGNOSIS — R519 Headache, unspecified: Secondary | ICD-10-CM | POA: Diagnosis not present

## 2020-09-04 DIAGNOSIS — M545 Low back pain, unspecified: Secondary | ICD-10-CM | POA: Insufficient documentation

## 2020-09-04 DIAGNOSIS — R079 Chest pain, unspecified: Secondary | ICD-10-CM | POA: Diagnosis not present

## 2020-09-04 DIAGNOSIS — R197 Diarrhea, unspecified: Secondary | ICD-10-CM | POA: Insufficient documentation

## 2020-09-04 DIAGNOSIS — R000 Tachycardia, unspecified: Secondary | ICD-10-CM | POA: Insufficient documentation

## 2020-09-04 DIAGNOSIS — R1010 Upper abdominal pain, unspecified: Secondary | ICD-10-CM | POA: Diagnosis not present

## 2020-09-04 DIAGNOSIS — F17210 Nicotine dependence, cigarettes, uncomplicated: Secondary | ICD-10-CM | POA: Insufficient documentation

## 2020-09-04 DIAGNOSIS — R040 Epistaxis: Secondary | ICD-10-CM | POA: Diagnosis not present

## 2020-09-04 DIAGNOSIS — R1030 Lower abdominal pain, unspecified: Secondary | ICD-10-CM | POA: Insufficient documentation

## 2020-09-04 DIAGNOSIS — R112 Nausea with vomiting, unspecified: Secondary | ICD-10-CM | POA: Insufficient documentation

## 2020-09-04 DIAGNOSIS — R10814 Left lower quadrant abdominal tenderness: Secondary | ICD-10-CM | POA: Insufficient documentation

## 2020-09-04 LAB — CBC WITH DIFFERENTIAL/PLATELET
Abs Immature Granulocytes: 0.06 10*3/uL (ref 0.00–0.07)
Basophils Absolute: 0 10*3/uL (ref 0.0–0.1)
Basophils Relative: 0 %
Eosinophils Absolute: 0 10*3/uL (ref 0.0–0.5)
Eosinophils Relative: 0 %
HCT: 44.3 % (ref 36.0–46.0)
Hemoglobin: 14.8 g/dL (ref 12.0–15.0)
Immature Granulocytes: 0 %
Lymphocytes Relative: 9 %
Lymphs Abs: 1.2 10*3/uL (ref 0.7–4.0)
MCH: 30.3 pg (ref 26.0–34.0)
MCHC: 33.4 g/dL (ref 30.0–36.0)
MCV: 90.6 fL (ref 80.0–100.0)
Monocytes Absolute: 0.6 10*3/uL (ref 0.1–1.0)
Monocytes Relative: 4 %
Neutro Abs: 11.8 10*3/uL — ABNORMAL HIGH (ref 1.7–7.7)
Neutrophils Relative %: 87 %
Platelets: 231 10*3/uL (ref 150–400)
RBC: 4.89 MIL/uL (ref 3.87–5.11)
RDW: 14.6 % (ref 11.5–15.5)
WBC: 13.8 10*3/uL — ABNORMAL HIGH (ref 4.0–10.5)
nRBC: 0 % (ref 0.0–0.2)

## 2020-09-04 LAB — LIPASE, BLOOD: Lipase: 119 U/L — ABNORMAL HIGH (ref 11–51)

## 2020-09-04 LAB — CBC
HCT: 39.6 % (ref 36.0–46.0)
Hemoglobin: 13.4 g/dL (ref 12.0–15.0)
MCH: 30.5 pg (ref 26.0–34.0)
MCHC: 33.8 g/dL (ref 30.0–36.0)
MCV: 90.2 fL (ref 80.0–100.0)
Platelets: 215 10*3/uL (ref 150–400)
RBC: 4.39 MIL/uL (ref 3.87–5.11)
RDW: 14.7 % (ref 11.5–15.5)
WBC: 7.7 10*3/uL (ref 4.0–10.5)
nRBC: 0 % (ref 0.0–0.2)

## 2020-09-04 LAB — URINALYSIS, MICROSCOPIC (REFLEX): Bacteria, UA: NONE SEEN

## 2020-09-04 LAB — BASIC METABOLIC PANEL
Anion gap: 10 (ref 5–15)
BUN: 8 mg/dL (ref 6–20)
CO2: 25 mmol/L (ref 22–32)
Calcium: 9.3 mg/dL (ref 8.9–10.3)
Chloride: 104 mmol/L (ref 98–111)
Creatinine, Ser: 0.76 mg/dL (ref 0.44–1.00)
GFR, Estimated: >60 mL/min (ref >60)
Glucose, Bld: 105 mg/dL — ABNORMAL HIGH (ref 70–99)
Potassium: 4.5 mmol/L (ref 3.5–5.1)
Sodium: 139 mmol/L (ref 135–145)

## 2020-09-04 LAB — COMPREHENSIVE METABOLIC PANEL
ALT: 12 U/L (ref 0–44)
AST: 18 U/L (ref 15–41)
Albumin: 4.6 g/dL (ref 3.5–5.0)
Alkaline Phosphatase: 45 U/L (ref 38–126)
Anion gap: 9 (ref 5–15)
BUN: 7 mg/dL (ref 6–20)
CO2: 25 mmol/L (ref 22–32)
Calcium: 9.4 mg/dL (ref 8.9–10.3)
Chloride: 103 mmol/L (ref 98–111)
Creatinine, Ser: 0.7 mg/dL (ref 0.44–1.00)
GFR, Estimated: >60 mL/min (ref >60)
Glucose, Bld: 109 mg/dL — ABNORMAL HIGH (ref 70–99)
Potassium: 3.9 mmol/L (ref 3.5–5.1)
Sodium: 137 mmol/L (ref 135–145)
Total Bilirubin: 0.2 mg/dL — ABNORMAL LOW (ref 0.3–1.2)
Total Protein: 8.1 g/dL (ref 6.5–8.1)

## 2020-09-04 LAB — URINALYSIS, ROUTINE W REFLEX MICROSCOPIC
Bilirubin Urine: NEGATIVE
Glucose, UA: NEGATIVE mg/dL
Ketones, ur: NEGATIVE mg/dL
Nitrite: NEGATIVE
Protein, ur: NEGATIVE mg/dL
Specific Gravity, Urine: 1.025 (ref 1.005–1.030)
pH: 6 (ref 5.0–8.0)

## 2020-09-04 LAB — POCT URINALYSIS DIPSTICK, ED / UC
Bilirubin Urine: NEGATIVE
Glucose, UA: NEGATIVE mg/dL
Ketones, ur: NEGATIVE mg/dL
Nitrite: NEGATIVE
Protein, ur: NEGATIVE mg/dL
Specific Gravity, Urine: 1.025 (ref 1.005–1.030)
Urobilinogen, UA: 0.2 mg/dL (ref 0.0–1.0)
pH: 5.5 (ref 5.0–8.0)

## 2020-09-04 LAB — I-STAT BETA HCG BLOOD, ED (MC, WL, AP ONLY): I-stat hCG, quantitative: <5 m[IU]/mL (ref <5)

## 2020-09-04 LAB — POC URINE PREG, ED: Preg Test, Ur: NEGATIVE

## 2020-09-04 MED ORDER — DICYCLOMINE HCL 10 MG PO CAPS
10.0000 mg | ORAL_CAPSULE | Freq: Once | ORAL | Status: AC
  Administered 2020-09-04: 10 mg via ORAL
  Filled 2020-09-04: qty 1

## 2020-09-04 MED ORDER — ONDANSETRON 4 MG PO TBDP
ORAL_TABLET | ORAL | Status: AC
  Filled 2020-09-04: qty 1

## 2020-09-04 MED ORDER — IOHEXOL 300 MG/ML  SOLN
100.0000 mL | Freq: Once | INTRAMUSCULAR | Status: AC | PRN
  Administered 2020-09-04: 100 mL via INTRAVENOUS

## 2020-09-04 MED ORDER — SODIUM CHLORIDE 0.9 % IV BOLUS
1000.0000 mL | Freq: Once | INTRAVENOUS | Status: AC
  Administered 2020-09-04: 1000 mL via INTRAVENOUS

## 2020-09-04 MED ORDER — MORPHINE SULFATE (PF) 4 MG/ML IV SOLN
4.0000 mg | Freq: Once | INTRAVENOUS | Status: AC
  Administered 2020-09-04: 4 mg via INTRAVENOUS
  Filled 2020-09-04: qty 1

## 2020-09-04 MED ORDER — ONDANSETRON 4 MG PO TBDP
4.0000 mg | ORAL_TABLET | Freq: Once | ORAL | Status: AC
  Administered 2020-09-04: 4 mg via ORAL

## 2020-09-04 MED ORDER — DICYCLOMINE HCL 20 MG PO TABS: 20.0000 mg | ORAL_TABLET | Freq: Two times a day (BID) | ORAL | 0 refills | Status: DC | PRN

## 2020-09-04 MED ORDER — OMEPRAZOLE 20 MG PO CPDR: 20.0000 mg | DELAYED_RELEASE_CAPSULE | Freq: Every day | ORAL | 0 refills | Status: DC

## 2020-09-04 MED ORDER — ONDANSETRON HCL 4 MG/2ML IJ SOLN
4.0000 mg | Freq: Once | INTRAMUSCULAR | Status: AC
  Administered 2020-09-04: 4 mg via INTRAVENOUS
  Filled 2020-09-04: qty 2

## 2020-09-04 MED ORDER — ONDANSETRON 4 MG PO TBDP: 4.0000 mg | ORAL_TABLET | Freq: Three times a day (TID) | ORAL | 0 refills | Status: DC | PRN

## 2020-09-04 NOTE — ED Notes (Signed)
Pt seen at [facility] and had blood work and urine done

## 2020-09-04 NOTE — ED Provider Notes (Signed)
[facility]    CSN: [id number] Arrival date & time: 09/04/20  0818      History   Chief Complaint Chief Complaint  Patient presents with  . Abdominal Pain    HPI [name] is a 34 y.o. female.   HPI  Patient has had nausea vomiting and abdominal pain that is going on for about a week.  Is getting worse over time.  She is doubled over in pain.  She states that she has not been able to eat a normal meal for that period of time.  She is trying to drink fluids in order not to become dehydrated.  She had a normal bowel movement today.  No diarrhea.  No fever or chills. Patient has a long history of ER visits and and GI visits for abdominal pain.  She previously had protracted vomiting from excessive marijuana use.  Has also had problems with acid reflux.  Previously was taking famotidine, Carafate, omeprazole with improvement.  She states she is not on any of these medications.  She states that this feels different. No one else at home is sick She has not had any recent travel She has had an upper GI endoscopy or colonoscopy No colon diseases in the family  Past Medical History:  Diagnosis Date  . HPV (human papilloma virus) infection     There are no problems to display for this patient.   Past Surgical History:  Procedure Laterality Date  . DIAGNOSTIC LAPAROSCOPY WITH REMOVAL OF ECTOPIC PREGNANCY N/A 03/27/2016   Procedure: Diagnostic  Laparoscopy,  Left Salpingectomy with Removal Ectopic Pregnancy;  Surgeon: Tereso Newcomer, MD;  Location: [facility];  Service: Gynecology;  Laterality: N/A;  . WISDOM TOOTH EXTRACTION      OB History    Gravida  1   Para  0   Term  0   Preterm  0   AB  1   Living  0     SAB  0   TAB  0   Ectopic  1   Multiple  0   Live Births               Home Medications    Prior to Admission medications   Medication Sig Start Date End Date Taking? Authorizing Provider  famotidine (PEPCID) 20 MG tablet Take 1  tablet (20 mg total) by mouth 2 (two) times daily. 10/17/19 09/04/20  Unk Lightning, PA  omeprazole (PRILOSEC) 20 MG capsule Take 1 capsule (20 mg total) by mouth daily. 10/17/19 09/04/20  Unk Lightning, PA  sucralfate (CARAFATE) 1 g tablet Take 1 tablet (1 g total) by mouth 4 (four) times daily. 10/17/19 09/04/20  Unk Lightning, PA    Family History Family History  Problem Relation Age of Onset  . Healthy Mother   . Seizures Father   . Heart failure Father   . Breast cancer Sister   . Diabetes Sister   . Diabetes Sister     Social History Social History   Tobacco Use  . Smoking status: Current Every Day Smoker    Packs/day: 0.25    Types: Cigarettes  . Smokeless tobacco: Never Used  Vaping Use  . Vaping Use: Some days  Substance Use Topics  . Alcohol use: Yes    Alcohol/week: 8.0 standard drinks    Types: 4 Glasses of wine, 4 Shots of liquor per week  . Drug use: Yes    Types: Marijuana  Comment: no longer since she found out she was pregnant     Allergies   Patient has no known allergies.   Review of Systems Review of Systems See HPI  Physical Exam Triage Vital Signs ED Triage Vitals  Enc Vitals Group     BP 09/04/20 0840 127/73     Pulse Rate 09/04/20 0840 89     Resp --      Temp 09/04/20 0840 98.5 F (36.9 C)     Temp Source 09/04/20 0840 Oral     SpO2 09/04/20 0840 100 %     Weight --      Height --      Head Circumference --      Peak Flow --      Pain Score 09/04/20 0846 8     Pain Loc --      Pain Edu? --      Excl. in GC? --    No data found.  Updated Vital Signs BP 127/73   Pulse 89   Temp 98.5 F (36.9 C) (Oral)   LMP 08/26/2020   SpO2 100%      Physical Exam Constitutional:      General: She is not in acute distress.    Appearance: She is well-developed.     Comments: Patient appears uncomfortable.  Holding abdomen and rocking back and forth on exam table  HENT:     Head: Normocephalic and atraumatic.      Mouth/Throat:     Mouth: Mucous membranes are moist.  Eyes:     Conjunctiva/sclera: Conjunctivae normal.     Pupils: Pupils are equal, round, and reactive to light.  Cardiovascular:     Rate and Rhythm: Normal rate and regular rhythm.  Pulmonary:     Effort: Pulmonary effort is normal. No respiratory distress.  Abdominal:     General: Bowel sounds are normal. There is no distension.     Palpations: Abdomen is soft. There is no hepatomegaly, splenomegaly or mass.     Tenderness: There is abdominal tenderness in the right lower quadrant and left lower quadrant. There is guarding. There is no rebound.     Hernia: No hernia is present.  Musculoskeletal:        General: Normal range of motion.     Cervical back: Normal range of motion.  Skin:    General: Skin is warm and dry.  Neurological:     Mental Status: She is alert.  Psychiatric:        Mood and Affect: Mood normal.        Behavior: Behavior normal.      UC Treatments / Results  Labs (all labs ordered are listed, but only abnormal results are displayed) Labs Reviewed  CBC WITH DIFFERENTIAL/PLATELET - Abnormal; Notable for the following components:      Result Value   WBC 13.8 (*)    Neutro Abs 11.8 (*)    All other components within normal limits  POCT URINALYSIS DIPSTICK, ED / UC - Abnormal; Notable for the following components:   Hgb urine dipstick SMALL (*)    Leukocytes,Ua TRACE (*)    All other components within normal limits  BASIC METABOLIC PANEL  POC URINE PREG, ED    EKG   Radiology DG Abd Acute W/Chest  Result Date: 09/04/2020 CLINICAL DATA:  Chest and abdominal pain EXAM: DG ABDOMEN ACUTE WITH 1 VIEW CHEST COMPARISON:  05/15/2019 FINDINGS: Cardiac shadow is within normal limits. The lungs are clear  bilaterally. No acute bony abnormality is noted. Scattered large and small bowel gas is seen. No abnormal mass or abnormal calcifications are noted. No obstructive changes are seen. No free air is  noted. The bony structures are within normal limits for the patient's age. IMPRESSION: No acute abnormality in the chest and abdomen Electronically Signed   By: Alcide Clever [name]   On: 09/04/2020 10:02    Procedures Procedures (including critical care time)  Medications Ordered in UC Medications - No data to display  Initial Impression / Assessment and Plan / UC Course  I have reviewed the triage vital signs and the nursing notes.  Pertinent labs & imaging results that were available during my care of the patient were reviewed by me and considered in my medical decision making (see chart for details).     Explained to patient that she has abdominal pain that is significant, unclear etiology, she has a high Duca count.  Needs additional evaluation.  Will be sent to the emergency room Final Clinical Impressions(s) / UC Diagnoses   Final diagnoses:  Lower abdominal pain     Discharge Instructions     YOU NEED TO GO TO THE ER FOR ADDITIONAL EVALUATION   ED Prescriptions    None     PDMP not reviewed this encounter.   Eustace Moore, MD 09/04/20 1055

## 2020-09-04 NOTE — ED Triage Notes (Signed)
Pt presents with c/o abdominal pain for one week. Pt also reports the nausea, vomiting, and diarrhea with the pain. Pt reports the pain is worse in the morning and feels better when she leans forward.

## 2020-09-04 NOTE — ED Notes (Signed)
Called pt x4 for room, no response. 

## 2020-09-04 NOTE — Discharge Instructions (Signed)
You were seen in the emergency department for nausea vomiting diarrhea and abdominal pain.  You had blood work urinalysis and a CAT scan of your abdomen and pelvis that did not show an obvious cause of your symptoms.  Your CT did show some dilation of your small bowel.  We are putting you on some medication for nausea and abdominal cramping along with an acid medication.  Please contact your GI doctor for close follow-up.  Return to the emergency department if any fever or worsening symptoms.

## 2020-09-04 NOTE — ED Notes (Signed)
ED Provider at bedside. 

## 2020-09-04 NOTE — Discharge Instructions (Addendum)
YOU NEED TO GO TO THE ER FOR ADDITIONAL EVALUATION

## 2020-09-04 NOTE — ED Triage Notes (Signed)
Pt has multiple complaints: Pt reports abd pain, lower back pain, n/v/d, headaches and nose bleeds for the past week.

## 2020-09-04 NOTE — ED Provider Notes (Signed)
[facility]-EMERGENCY DEPT Provider Note   CSN: [id number] Arrival date & time: 09/04/20  1847     History Chief Complaint  Patient presents with  . Abdominal Pain    [name] is a 34 y.o. female.  She has chronic abdominal pain and has seen GI in the past.  She said she has learned how to deal with that.  She continues to smoke, drink alcohol, use NSAIDs, and smoke marijuana.  She was counseled to abstain from all of those.  She is complaining of 1 week of lower abdominal pain associated with nausea.  She said she is vomited 4 times and had 3 loose bowel movements.  No blood.  She had 2 episodes of nosebleed.  No known fever.  No vaginal discharge.  She states her last normal period was October 29 and then she had a few days of bleeding this week that is stopped.  No urinary symptoms.  The history is provided by the patient.  Abdominal Pain Pain location:  LLQ Pain quality: aching   Pain severity:  Moderate Onset quality:  Gradual Duration:  1 week Timing:  Constant Progression:  Worsening Chronicity:  New Context: alcohol use   Context: not trauma   Relieved by:  None tried Worsened by:  Nothing Ineffective treatments:  None tried Associated symptoms: diarrhea, nausea, vaginal bleeding and vomiting   Associated symptoms: no chest pain, no cough, no dysuria, no fever, no hematemesis, no hematochezia, no hematuria, no melena, no shortness of breath, no sore throat and no vaginal discharge   Risk factors: NSAID use   Risk factors: not pregnant        Past Medical History:  Diagnosis Date  . HPV (human papilloma virus) infection     There are no problems to display for this patient.   Past Surgical History:  Procedure Laterality Date  . DIAGNOSTIC LAPAROSCOPY WITH REMOVAL OF ECTOPIC PREGNANCY N/A 03/27/2016   Procedure: Diagnostic  Laparoscopy,  Left Salpingectomy with Removal Ectopic Pregnancy;  Surgeon: Tereso Newcomer, MD;  Location: [facility];  Service: Gynecology;  Laterality: N/A;  . WISDOM TOOTH EXTRACTION       OB History    Gravida  1   Para  0   Term  0   Preterm  0   AB  1   Living  0     SAB  0   TAB  0   Ectopic  1   Multiple  0   Live Births              Family History  Problem Relation Age of Onset  . Healthy Mother   . Seizures Father   . Heart failure Father   . Breast cancer Sister   . Diabetes Sister   . Diabetes Sister     Social History   Tobacco Use  . Smoking status: Current Every Day Smoker    Packs/day: 0.25    Types: Cigarettes  . Smokeless tobacco: Never Used  Vaping Use  . Vaping Use: Some days  Substance Use Topics  . Alcohol use: Yes    Alcohol/week: 8.0 standard drinks    Types: 4 Glasses of wine, 4 Shots of liquor per week  . Drug use: Yes    Types: Marijuana    Comment: no longer since she found out she was pregnant    Home Medications Prior to Admission medications   Medication Sig Start Date End Date Taking?  Authorizing Provider  famotidine (PEPCID) 20 MG tablet Take 1 tablet (20 mg total) by mouth 2 (two) times daily. 10/17/19 09/04/20  Unk Lightning, PA  omeprazole (PRILOSEC) 20 MG capsule Take 1 capsule (20 mg total) by mouth daily. 10/17/19 09/04/20  Unk Lightning, PA  sucralfate (CARAFATE) 1 g tablet Take 1 tablet (1 g total) by mouth 4 (four) times daily. 10/17/19 09/04/20  Unk Lightning, PA    Allergies    Patient has no known allergies.  Review of Systems   Review of Systems  Constitutional: Negative for fever.  HENT: Negative for sore throat.   Eyes: Negative for visual disturbance.  Respiratory: Negative for cough and shortness of breath.   Cardiovascular: Negative for chest pain.  Gastrointestinal: Positive for abdominal pain, diarrhea, nausea and vomiting. Negative for hematemesis, hematochezia and melena.  Genitourinary: Positive for vaginal bleeding. Negative for dysuria, hematuria and vaginal discharge.    Musculoskeletal: Negative for neck pain.  Skin: Negative for rash.  Neurological: Negative for headaches.    Physical Exam Updated Vital Signs BP (!) 142/94   Pulse (!) 128   Temp 98.8 F (37.1 C) (Oral)   Resp 16   Ht 5\' 6"  (1.676 m)   Wt 79.4 kg   LMP 08/26/2020   SpO2 98%   BMI 28.25 kg/m   Physical Exam Vitals and nursing note reviewed.  Constitutional:      General: She is not in acute distress.    Appearance: Normal appearance. She is well-developed.  HENT:     Head: Normocephalic and atraumatic.  Eyes:     Conjunctiva/sclera: Conjunctivae normal.  Cardiovascular:     Rate and Rhythm: Regular rhythm. Tachycardia present.     Heart sounds: No murmur heard.   Pulmonary:     Effort: Pulmonary effort is normal. No respiratory distress.     Breath sounds: Normal breath sounds.  Abdominal:     Palpations: Abdomen is soft.     Tenderness: There is abdominal tenderness in the left lower quadrant.  Musculoskeletal:        General: No deformity or signs of injury. Normal range of motion.     Cervical back: Neck supple.  Skin:    General: Skin is warm and dry.  Neurological:     General: No focal deficit present.     Mental Status: She is alert.     ED Results / Procedures / Treatments   Labs (all labs ordered are listed, but only abnormal results are displayed) Labs Reviewed  LIPASE, BLOOD - Abnormal; Notable for the following components:      Result Value   Lipase 119 (*)    All other components within normal limits  COMPREHENSIVE METABOLIC PANEL - Abnormal; Notable for the following components:   Glucose, Bld 109 (*)    Total Bilirubin 0.2 (*)    All other components within normal limits  URINALYSIS, ROUTINE W REFLEX MICROSCOPIC - Abnormal; Notable for the following components:   APPearance HAZY (*)    Hgb urine dipstick TRACE (*)    Leukocytes,Ua TRACE (*)    All other components within normal limits  CBC  URINALYSIS, MICROSCOPIC (REFLEX)  I-STAT  BETA HCG BLOOD, ED ([facility], [facility], [facility] ONLY)    EKG None  Radiology CT Abdomen Pelvis W Contrast  Result Date: 09/04/2020 CLINICAL DATA:  One week of abdominal pain EXAM: CT ABDOMEN AND PELVIS WITH CONTRAST TECHNIQUE: Multidetector CT imaging of the abdomen and pelvis was performed using  the standard protocol following bolus administration of intravenous contrast. CONTRAST:  OMNIPAQUE IOHEXOL 300 MG/ML  SOLN COMPARISON:  Radiograph 09/04/2020, CT 10/02/2018 FINDINGS: Lower chest: Lung bases demonstrate no acute consolidation or effusion. Hepatobiliary: No focal liver abnormality is seen. No gallstones, gallbladder wall thickening, or biliary dilatation. Pancreas: Unremarkable. No pancreatic ductal dilatation or surrounding inflammatory changes. Spleen: Normal in size without focal abnormality. Adrenals/Urinary Tract: Adrenal glands are unremarkable. Kidneys are normal, without renal calculi, focal lesion, or hydronephrosis. Bladder is unremarkable. Stomach/Bowel: Stomach is nonenlarged. Fluid-filled nondistended small bowel in the pelvis. No wall thickening. Negative appendix. Mild fluid within the colon. Vascular/Lymphatic: No significant vascular findings are present. No enlarged abdominal or pelvic lymph nodes. Reproductive: Uterus and bilateral adnexa are unremarkable. Other: No free air.  Small free fluid in the pelvis. Musculoskeletal: No acute or significant osseous findings. IMPRESSION: 1. No CT evidence for acute intra-abdominal or pelvic abnormality. 2. Mild fluid-filled nondistended pelvic small bowel loops with fluid in the colon suggesting enteritis/diarrheal process. 3. Small free fluid in the pelvis Electronically Signed   By: Jasmine Pang [name]   On: 09/04/2020 21:20   DG Abd Acute W/Chest  Result Date: 09/04/2020 CLINICAL DATA:  Chest and abdominal pain EXAM: DG ABDOMEN ACUTE WITH 1 VIEW CHEST COMPARISON:  05/15/2019 FINDINGS: Cardiac shadow is within normal limits. The lungs are clear  bilaterally. No acute bony abnormality is noted. Scattered large and small bowel gas is seen. No abnormal mass or abnormal calcifications are noted. No obstructive changes are seen. No free air is noted. The bony structures are within normal limits for the patient's age. IMPRESSION: No acute abnormality in the chest and abdomen Electronically Signed   By: Alcide Clever [name]   On: 09/04/2020 10:02    Procedures Procedures (including critical care time)  Medications Ordered in ED Medications  morphine 4 MG/ML injection 4 mg (has no administration in time range)  ondansetron (ZOFRAN) injection 4 mg (has no administration in time range)  sodium chloride 0.9 % bolus 1,000 mL (has no administration in time range)    ED Course  I have reviewed the triage vital signs and the nursing notes.  Pertinent labs & imaging results that were available during my care of the patient were reviewed by me and considered in my medical decision making (see chart for details).    MDM Rules/Calculators/A&P                         This patient complains of lower abdominal pain and diarrhea; this involves an extensive number of treatment Options and is a complaint that carries with it a high risk of complications and Morbidity. The differential includes gastroenteritis, colitis, diverticulitis, diverticulosis, UTI, PID, appendicitis  I ordered, reviewed and interpreted labs, which included CBC with normal [place] count normal hemoglobin, chemistries normal other than mildly elevated glucose likely reactive, normal LFTs.  Lipase is elevated at 119.  Urinalysis without gross signs of infection, pregnancy test negative I ordered medication IV fluids and pain medication, nausea medication, spasm medication I ordered imaging studies which included CT abdomen and pelvis with contrast and I independently    visualized and interpreted imaging which showed some dilated loops of bowel with air-fluid filled Previous records  obtained and reviewed in epic including prior GI visits in January  After the interventions stated above, I reevaluated the patient and found patient still to be having some low abdominal cramping.  Has had no diarrhea  here.  Exam is soft and vitals unremarkable.  Reviewed work-up with her.  We will put her on some spasm medication and recommend close follow-up with PCP and GI.  Return instructions discussed.   Final Clinical Impression(s) / ED Diagnoses Final diagnoses:  Nausea vomiting and diarrhea  Lower abdominal pain    Rx / DC Orders ED Discharge Orders         Ordered    dicyclomine (BENTYL) 20 MG tablet  2 times daily PRN        09/04/20 2151    omeprazole (PRILOSEC) 20 MG capsule  Daily        09/04/20 2151    ondansetron (ZOFRAN ODT) 4 MG disintegrating tablet  Every 8 hours PRN        09/04/20 2152           Terrilee FilesButler, Sheneika Walstad C, MD 09/05/20 [phone]

## 2020-09-04 NOTE — ED Triage Notes (Signed)
Lower left abdominal pain began one week ago. It is now on in the lower mid region of her abdomin and radiates to her flank bilaterally. Rates the pain 8/10 with n/v when the pain spikes and is also having nose bleeds from emesis.

## 2020-09-05 ENCOUNTER — Telehealth: Payer: Self-pay | Admitting: *Deleted

## 2020-09-05 NOTE — Telephone Encounter (Signed)
Transition Care Management Unsuccessful Follow-up Telephone Call  Date of discharge and from where:  09/04/20 - [facility]  Attempts:  1st Attempt  Reason for unsuccessful TCM follow-up call:  Unable to reach patient    

## 2020-09-07 NOTE — Telephone Encounter (Signed)
Transition Care Management Unsuccessful Follow-up Telephone Call  Date of discharge and from where:  09/04/2020 from [facility]  Attempts:  2nd Attempt  Reason for unsuccessful TCM follow-up call:  Left voice message

## 2020-09-10 NOTE — Telephone Encounter (Signed)
Transition Care Management Unsuccessful Follow-up Telephone Call  Date of discharge and from where:  09/04/20 [name]   Attempts:  3rd Attempt  Reason for unsuccessful TCM follow-up call:  Left voice message

## 2020-09-21 DIAGNOSIS — F411 Generalized anxiety disorder: Secondary | ICD-10-CM | POA: Diagnosis not present

## 2020-10-23 DIAGNOSIS — F4010 Social phobia, unspecified: Secondary | ICD-10-CM | POA: Diagnosis not present

## 2020-11-08 ENCOUNTER — Other Ambulatory Visit: Payer: Self-pay | Admitting: Physician Assistant

## 2021-06-08 IMAGING — CT CT ABD-PELV W/ CM
2 of 4 series · 15 of 46 positions shown, 17 images · IV contrast (OMNIPAQUE 300)
Comparison: Radiograph 09/04/2020, CT 10/02/2018

CLINICAL DATA: One week of abdominal pain

EXAM:
CT ABDOMEN AND PELVIS WITH CONTRAST
TECHNIQUE: Multidetector CT imaging of the abdomen and pelvis was performed
using the standard protocol following bolus administration of
intravenous contrast.
CONTRAST:  100mL OMNIPAQUE IOHEXOL 300 MG/ML  SOLN

[Series 3: axial st · axial · 0.83mm/px · z∈[-494,-88]mm · 12 of 93 slices shown, 14 images]
[im 6/93  soft-tissue]
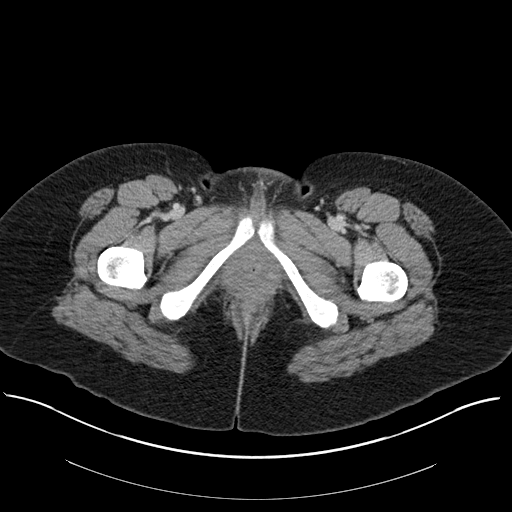
[im 6/93  bone]
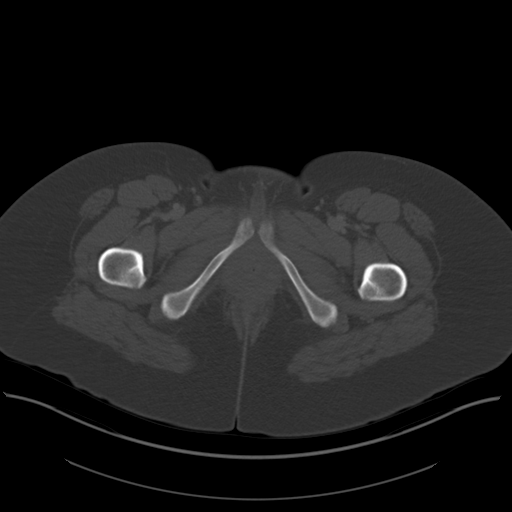
[im 17/93  soft-tissue]
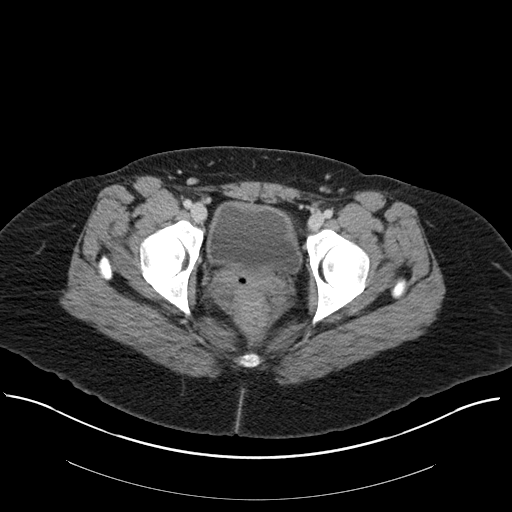
[im 22/93  soft-tissue]
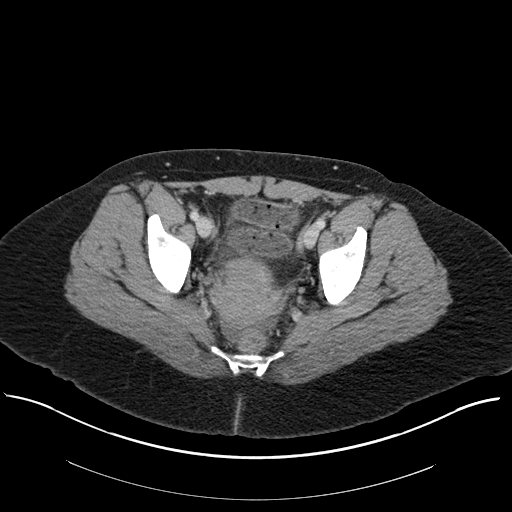
[im 28/93  soft-tissue]
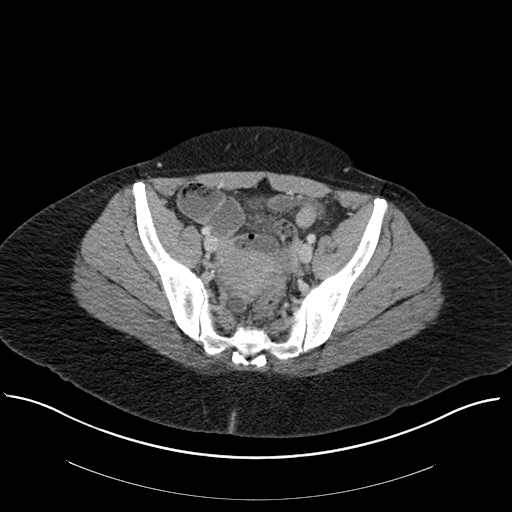
[im 38/93  soft-tissue]
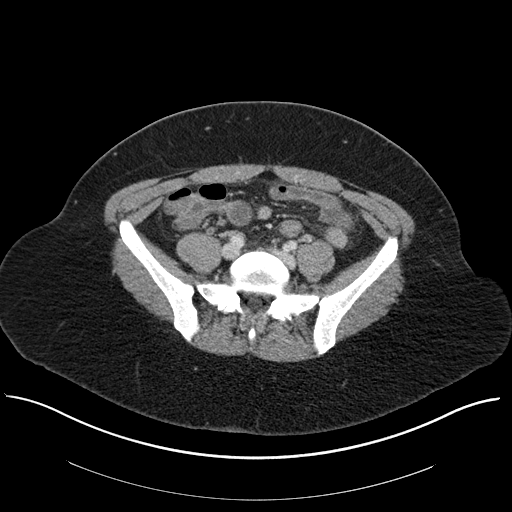
[im 44/93  soft-tissue]
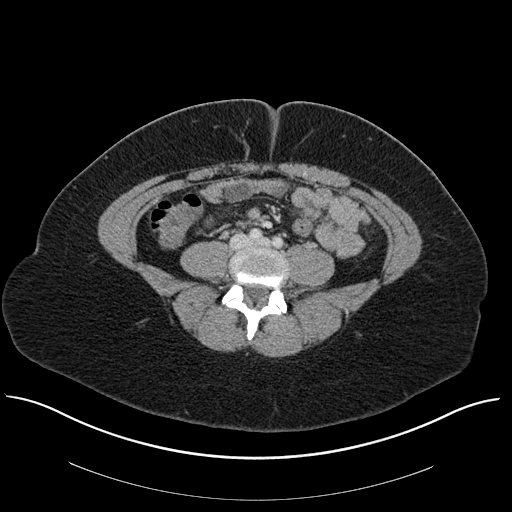
[im 49/93  soft-tissue]
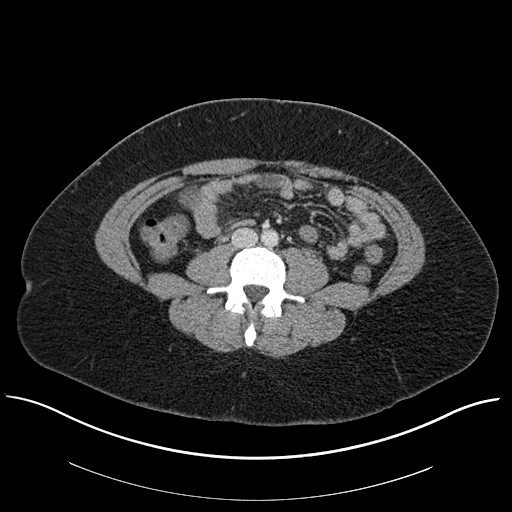
[im 60/93  soft-tissue]
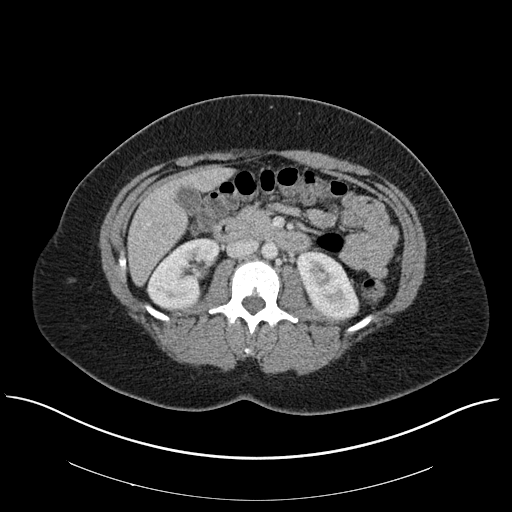
[im 65/93  soft-tissue]
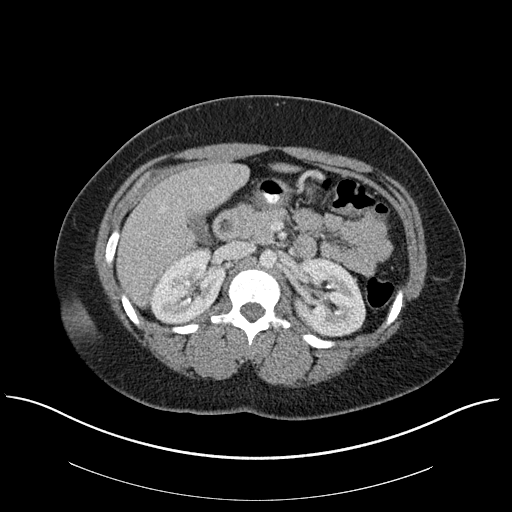
[im 65/93  bone]
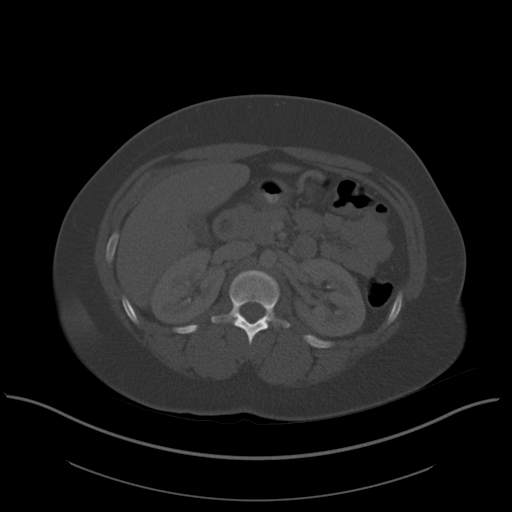
[im 71/93  soft-tissue]
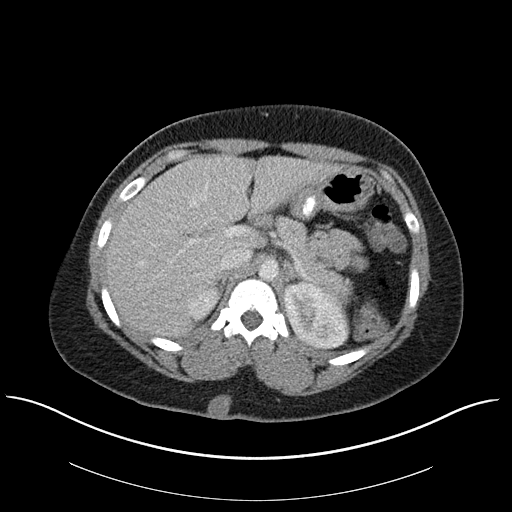
[im 82/93  soft-tissue]
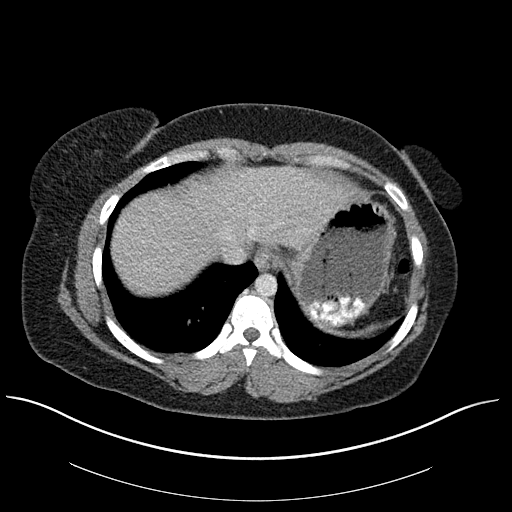
[im 87/93  soft-tissue]
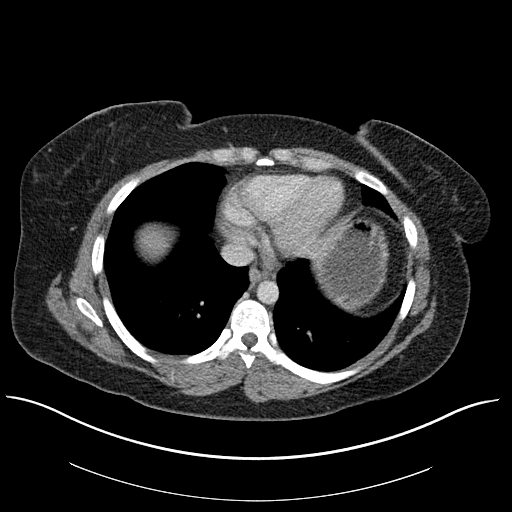

[Series 6: coronal st · coronal · 0.63mm/px · 3 of 126 slices shown]
[im 42/126  soft-tissue]
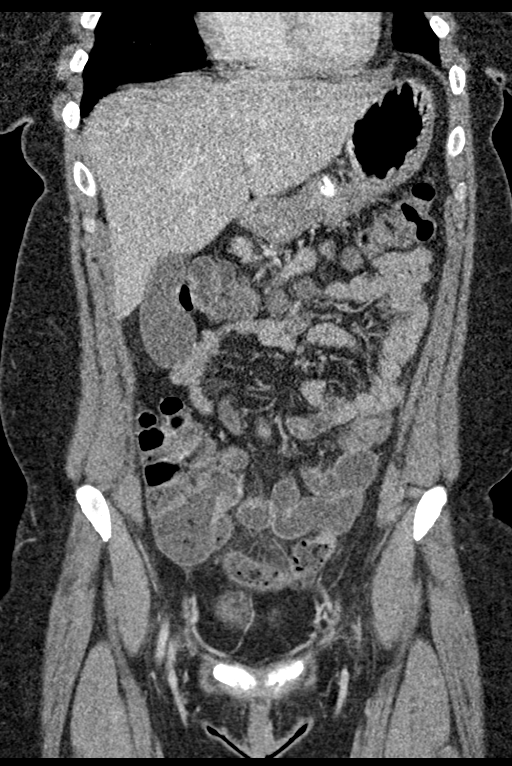
[im 56/126  soft-tissue]
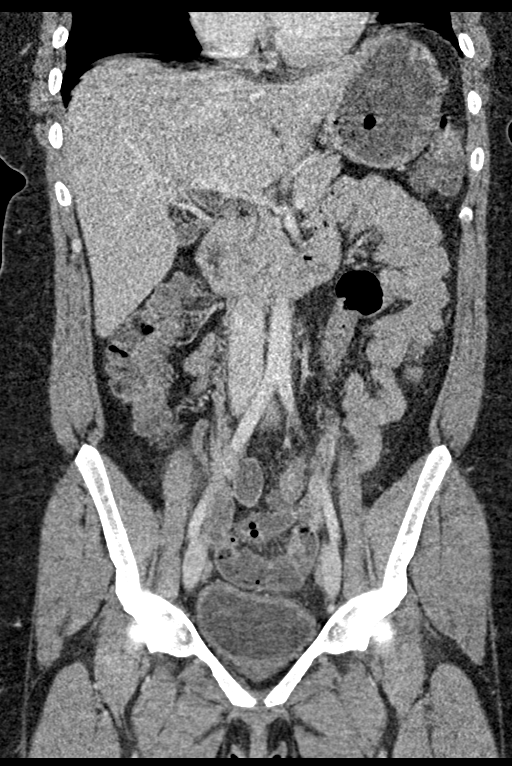
[im 70/126  soft-tissue]
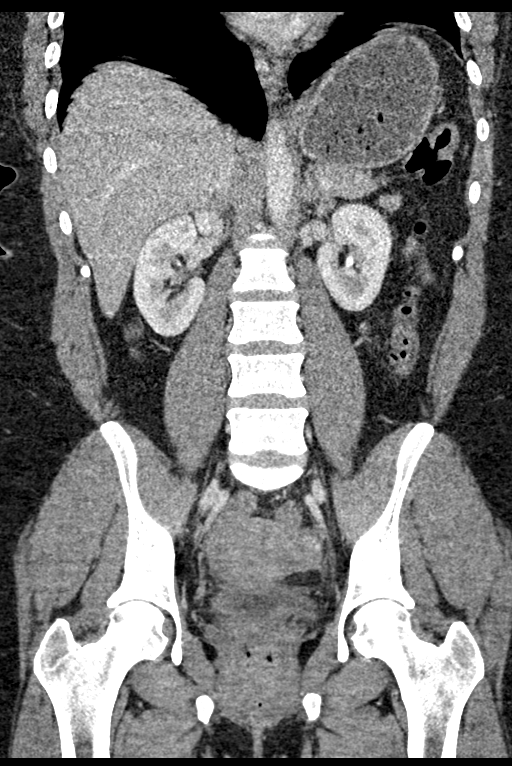

[15 of 46 positions shown; findings below may reference images not displayed]

FINDINGS: Lower chest: Lung bases demonstrate no acute consolidation or
effusion.

Hepatobiliary: No focal liver abnormality is seen. No gallstones,
gallbladder wall thickening, or biliary dilatation.

Pancreas: Unremarkable. No pancreatic ductal dilatation or
surrounding inflammatory changes.

Spleen: Normal in size without focal abnormality.

Adrenals/Urinary Tract: Adrenal glands are unremarkable. Kidneys are
normal, without renal calculi, focal lesion, or hydronephrosis.
Bladder is unremarkable.

Stomach/Bowel: Stomach is nonenlarged. Fluid-filled nondistended
small bowel in the pelvis. No wall thickening. Negative appendix.
Mild fluid within the colon.

Vascular/Lymphatic: No significant vascular findings are present. No
enlarged abdominal or pelvic lymph nodes.

Reproductive: Uterus and bilateral adnexa are unremarkable.

Other: No free air.  Small free fluid in the pelvis.

Musculoskeletal: No acute or significant osseous findings.
IMPRESSION: 1. No CT evidence for acute intra-abdominal or pelvic abnormality.
2. Mild fluid-filled nondistended pelvic small bowel loops with
fluid in the colon suggesting enteritis/diarrheal process.
3. Small free fluid in the pelvis

## 2023-04-09 ENCOUNTER — Ambulatory Visit (HOSPITAL_COMMUNITY)
Admission: EM | Admit: 2023-04-09 | Discharge: 2023-04-09 | Disposition: A | Discharge status: Home / Self Care | Payer: 59 | Attending: Family Medicine | Admitting: Family Medicine

## 2023-04-09 ENCOUNTER — Encounter (HOSPITAL_COMMUNITY): Payer: Self-pay

## 2023-04-09 ENCOUNTER — Ambulatory Visit (INDEPENDENT_AMBULATORY_CARE_PROVIDER_SITE_OTHER): Payer: 59

## 2023-04-09 DIAGNOSIS — R0602 Shortness of breath: Secondary | ICD-10-CM | POA: Diagnosis not present

## 2023-04-09 DIAGNOSIS — R519 Headache, unspecified: Secondary | ICD-10-CM | POA: Diagnosis not present

## 2023-04-09 LAB — POCT URINALYSIS DIP (MANUAL ENTRY)
Bilirubin, UA: NEGATIVE
Glucose, UA: NEGATIVE mg/dL
Ketones, POC UA: NEGATIVE mg/dL
Leukocytes, UA: NEGATIVE
Nitrite, UA: NEGATIVE
Protein Ur, POC: NEGATIVE mg/dL
Spec Grav, UA: 1.015 (ref 1.010–1.025)
Urobilinogen, UA: 1 E.U./dL
pH, UA: 7 (ref 5.0–8.0)

## 2023-04-09 LAB — POCT URINE PREGNANCY: Preg Test, Ur: NEGATIVE

## 2023-04-09 MED ORDER — ALBUTEROL SULFATE HFA 108 (90 BASE) MCG/ACT IN AERS: 2.0000 | INHALATION_SPRAY | RESPIRATORY_TRACT | 0 refills | Status: AC | PRN

## 2023-04-09 NOTE — ED Triage Notes (Signed)
Pt c/o headache, dizziness, and SOB on exertion x2 days. Taking tylenol with no relief.

## 2023-04-09 NOTE — ED Provider Notes (Signed)
[facility]    CSN: [id number] Arrival date & time: 04/09/23  1422      History   Chief Complaint Chief Complaint  Patient presents with   Headache    HPI [name] is a 37 y.o. female.   HPI patient presents to with acute onset headache, dizziness and exertional shortness of breath while at work.  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  Patient has no history of asthma, or COPD however she is a daily smoker. Patient also reports that she has had a frontal headache today with no associated sinus congestion or visual changes. Patient reports someone passed away that worked with her and she is uncertain if she was experiencing a panic attack.  She has no history of anxiety. Past Medical History:  Diagnosis Date   HPV (human papilloma virus) infection     There are no problems to display for this patient.   Past Surgical History:  Procedure Laterality Date   DIAGNOSTIC LAPAROSCOPY WITH REMOVAL OF ECTOPIC PREGNANCY N/A 03/27/2016   Procedure: Diagnostic  Laparoscopy,  Left Salpingectomy with Removal Ectopic Pregnancy;  Surgeon: Tereso Newcomer, MD;  Location: [facility];  Service: Gynecology;  Laterality: N/A;   WISDOM TOOTH EXTRACTION      OB History     Gravida  1   Para  0   Term  0   Preterm  0   AB  1   Living  0      SAB  0   IAB  0   Ectopic  1   Multiple  0   Live Births               Home Medications    Prior to Admission medications   Medication Sig Start Date End Date Taking? Authorizing Provider  albuterol (VENTOLIN HFA) 108 (90 Base) MCG/ACT inhaler Inhale 2 puffs into the lungs every 4 (four) hours as needed for shortness of breath or wheezing (or chest tightness). 04/09/23  Yes Bing Neighbors, NP  acetaminophen (TYLENOL) 500 MG tablet Take 2,000 mg by mouth every 6 (six) hours as needed for  moderate pain.    [provider]  famotidine (PEPCID) 20 MG tablet Take 1 tablet (20 mg total) by mouth 2 (two) times daily. 10/17/19 09/04/20  Unk Lightning, PA  sucralfate (CARAFATE) 1 g tablet Take 1 tablet (1 g total) by mouth 4 (four) times daily. 10/17/19 09/04/20  Unk Lightning, PA    Family History Family History  Problem Relation Age of Onset   Healthy Mother    Seizures Father    Heart failure Father    Breast cancer Sister    Diabetes Sister    Diabetes Sister     Social History Social History   Tobacco Use   Smoking status: Every Day    Packs/day: .25    Types: Cigarettes   Smokeless tobacco: Never  Vaping Use   Vaping Use: Some days  Substance Use Topics   Alcohol use: Yes    Alcohol/week: 8.0 standard drinks of alcohol    Types: 4 Glasses of wine, 4 Shots of liquor per week   Drug use: Yes    Types: Marijuana    Comment: no longer since she found out she was pregnant     Allergies   Patient has no known allergies.  Review of Systems Review of Systems Pertinent negatives listed in HPI   Physical Exam Triage Vital Signs ED Triage Vitals  Enc Vitals Group     BP      Pulse      Resp      Temp      Temp src      SpO2      Weight      Height      Head Circumference      Peak Flow      Pain Score      Pain Loc      Pain Edu?      Excl. in GC?    No data found.  Updated Vital Signs BP 116/77 (BP Location: Left Arm)   Pulse 84   Temp 98.4 F (36.9 C) (Oral)   Resp 18   LMP 03/12/2023   SpO2 98%   Visual Acuity Right Eye Distance:   Left Eye Distance:   Bilateral Distance:    Right Eye Near:   Left Eye Near:    Bilateral Near:     Physical Exam Vitals reviewed.  Constitutional:      Appearance: She is well-developed.  HENT:     Head: Normocephalic.  Eyes:     Extraocular Movements: Extraocular movements intact.  Cardiovascular:     Rate and Rhythm: Normal rate and regular rhythm.  Pulmonary:      Effort: Pulmonary effort is normal. No respiratory distress.     Breath sounds: Normal breath sounds. No wheezing, rhonchi or rales.  Musculoskeletal:     Cervical back: Neck supple.  Skin:    General: Skin is warm and dry.  Neurological:     Mental Status: She is alert and oriented to person, place, and time.     GCS: GCS eye subscore is 4. GCS verbal subscore is 5. GCS motor subscore is 6.      UC Treatments / Results  Labs (all labs ordered are listed, but only abnormal results are displayed) Labs Reviewed  POCT URINALYSIS DIP (MANUAL ENTRY) - Abnormal; Notable for the following components:      Result Value   Blood, UA trace-intact (*)    All other components within normal limits  POCT URINE PREGNANCY    EKG   Radiology DG Chest 2 View  Result Date: 04/09/2023 CLINICAL DATA:  Shortness of breath EXAM: CHEST - 2 VIEW COMPARISON:  09/04/2020 FINDINGS: Cardiac and mediastinal contours are within normal limits. No focal pulmonary opacity. No pleural effusion or pneumothorax. No acute osseous abnormality. IMPRESSION: No acute cardiopulmonary process. Electronically Signed   By: Wiliam Ke [name]   On: 04/09/2023 16:01    Procedures Procedures (including critical care time)  Medications Ordered in UC Medications - No data to display  Initial Impression / Assessment and Plan / UC Course  I have reviewed the triage vital signs and the nursing notes.  Pertinent labs & imaging results that were available during my care of the patient were reviewed by me and considered in my medical decision making (see chart for details).   Exertional shortness of breath, chest x-ray unremarkable.  Patient is a daily smoker could be early signs of chronic bronchitis symptoms resolved with rest.  Prescribed an albuterol inhaler 2 puffs every 6-8 hours as needed when symptoms develop.  Blood pressure stable excision level normal.  No identifiable cause for headache.  Encouraged OTC medication for  headache.  Symptoms worsen or do not  improve return for evaluation. Final Clinical Impressions(s) / UC Diagnoses   Final diagnoses:  Exertional shortness of breath  Acute intractable headache, unspecified headache type     Discharge Instructions      Your urine indicates that you well hydrated and vital signs are within normal range, therefore no identifiable cause of headache today. Recommend taking tylenol or ibuprofen for headache pain. For shortness of breath, reviewed chest x-ray plain film which does not show any obvious infection or reason for shortness of breath with exertion.  Will send to the radiologist to take a second look in the meantime, I am prescribing albuterol inhaler 2 puffs to use every 6 hours as needed for shortness of breath or chest tightness.       ED Prescriptions     Medication Sig Dispense Auth. Provider   albuterol (VENTOLIN HFA) 108 (90 Base) MCG/ACT inhaler Inhale 2 puffs into the lungs every 4 (four) hours as needed for shortness of breath or wheezing (or chest tightness). 8 g Bing Neighbors, NP      PDMP not reviewed this encounter.   Bing Neighbors, NP 04/09/23 [phone]

## 2023-04-09 NOTE — Discharge Instructions (Addendum)
Your urine indicates that you well hydrated and vital signs are within normal range, therefore no identifiable cause of headache today. Recommend taking tylenol or ibuprofen for headache pain. For shortness of breath, reviewed chest x-ray plain film which does not show any obvious infection or reason for shortness of breath with exertion.  Will send to the radiologist to take a second look in the meantime, I am prescribing albuterol inhaler 2 puffs to use every 6 hours as needed for shortness of breath or chest tightness.

## 2023-08-31 ENCOUNTER — Encounter: Payer: Self-pay | Admitting: General Practice

## 2023-08-31 ENCOUNTER — Ambulatory Visit: Payer: 59 | Admitting: Family Medicine

## 2024-07-13 ENCOUNTER — Encounter (HOSPITAL_COMMUNITY): Payer: Self-pay | Admitting: Emergency Medicine

## 2024-07-13 ENCOUNTER — Ambulatory Visit (HOSPITAL_COMMUNITY)
Admission: EM | Admit: 2024-07-13 | Discharge: 2024-07-13 | Disposition: A | Discharge status: Home / Self Care | Attending: Internal Medicine | Admitting: Internal Medicine

## 2024-07-13 DIAGNOSIS — M5441 Lumbago with sciatica, right side: Secondary | ICD-10-CM

## 2024-07-13 DIAGNOSIS — Z3202 Encounter for pregnancy test, result negative: Secondary | ICD-10-CM

## 2024-07-13 DIAGNOSIS — R350 Frequency of micturition: Secondary | ICD-10-CM

## 2024-07-13 LAB — POCT URINALYSIS DIP (MANUAL ENTRY)
Bilirubin, UA: NEGATIVE
Glucose, UA: NEGATIVE mg/dL
Ketones, POC UA: NEGATIVE mg/dL
Leukocytes, UA: NEGATIVE
Nitrite, UA: NEGATIVE
Protein Ur, POC: NEGATIVE mg/dL
Spec Grav, UA: >=1.03 — AB (ref 1.010–1.025)
Urobilinogen, UA: 0.2 U/dL
pH, UA: 6 (ref 5.0–8.0)

## 2024-07-13 LAB — POCT URINE PREGNANCY: Preg Test, Ur: NEGATIVE

## 2024-07-13 MED ORDER — BACLOFEN 10 MG PO TABS: 10.0000 mg | ORAL_TABLET | Freq: Three times a day (TID) | ORAL | 0 refills | Status: AC

## 2024-07-13 MED ORDER — KETOROLAC TROMETHAMINE 30 MG/ML IJ SOLN
30.0000 mg | Freq: Once | INTRAMUSCULAR | Status: AC
  Administered 2024-07-13: 30 mg via INTRAMUSCULAR

## 2024-07-13 MED ORDER — KETOROLAC TROMETHAMINE 30 MG/ML IJ SOLN
INTRAMUSCULAR | Status: AC
  Filled 2024-07-13: qty 1

## 2024-07-13 MED ORDER — NAPROXEN 500 MG PO TABS: 500.0000 mg | ORAL_TABLET | Freq: Two times a day (BID) | ORAL | 0 refills | Status: AC

## 2024-07-13 NOTE — Discharge Instructions (Signed)
 Your back pain is likely due to a muscle strain which will improve on its own with time.   We gave you ketorolac  injection in the clinic today, so do not take any NSAIDs (ibuprofen , naproxen, aleve, naproxen, goody powders) for 24 hours. This medicine will help with inflammation associated with the pulled muscle.   Start taking naproxen tomorrow morning.   Continue tylenol  1,000mg  every 6 hours as needed for pain.   You may take tylenol  as needed for aches and pains.  Take muscle relaxer as needed for muscle spasm, mostly take this at bedtime as this medicine can cause drowsiness.  Apply heat to the pulled muscle 20 minutes on 20 minutes off as needed, heat relaxes muscles.  Perform gentle exercises and stretches to area of tenderness.  I would like for you to rest, however I do not want you to avoid moving the area. Movement and stretching will help with healing.  Red flag symptoms to watch out for are numbness/tingling to the legs, weakness, loss of bowel/bladder control, and/or worsening pain that does not respond well to medicines.  Follow-up with your primary care provider or return to urgent care if your symptoms do not improve in the next 3 to 4 days with medications and interventions recommended today. If your symptoms are severe (red flag), please go to the emergency room.

## 2024-07-13 NOTE — ED Provider Notes (Addendum)
 [facility]    CSN: [id number] Arrival date & time: 07/13/24  1000      History   Chief Complaint Chief Complaint  Patient presents with   Back Pain    HPI [name] is a 38 y.o. female.   [name] is a 38 y.o. female presenting for chief complaint of right sided low back pain that started 3 days ago.  Pain intermittently radiates to the right leg and the right foot.  Pain has been constant with episodic spasms worsened with movement such as bending and twisting.  Pain is also worse with palpation.  She has had urinary urge with urinary hesitancy over the last couple days since pain started.  Last normal bowel movement was 2 days ago.  Denies blood/mucus in the stool.  She has had urinary frequency without dysuria, abdominal pain, nausea, vomiting, diarrhea, or rashes.  Denies recent fevers or chills.  No urinary/fecal incontinence or saddle anesthesia.  Denies unilateral extremity weakness and previous injuries to the low back.  Denies history of surgeries to the low back.  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  Denies history of kidney stone. She is taking tylenol  at home with minimal relief of symptoms.   LMP 06/26/2024, denies chance of pregnancy.    Back Pain   Past Medical History:  Diagnosis Date   HPV (human papilloma virus) infection     There are no active problems to display for this patient.   Past Surgical History:  Procedure Laterality Date   DIAGNOSTIC LAPAROSCOPY WITH REMOVAL OF ECTOPIC PREGNANCY N/A 03/27/2016   Procedure: Diagnostic  Laparoscopy,  Left Salpingectomy with Removal Ectopic Pregnancy;  Surgeon: Gloris DELENA Hugger, MD;  Location: [facility];  Service: Gynecology;  Laterality: N/A;   WISDOM TOOTH EXTRACTION      OB History     Gravida  1   Para  0   Term  0   Preterm  0   AB  1   Living  0      SAB  0   IAB  0   Ectopic  1   Multiple   0   Live Births               Home Medications    Prior to Admission medications   Medication Sig Start Date End Date Taking? Authorizing Provider  baclofen (LIORESAL) 10 MG tablet Take 1 tablet (10 mg total) by mouth 3 (three) times daily. 07/13/24  Yes Enedelia Dorna HERO, FNP  naproxen (NAPROSYN) 500 MG tablet Take 1 tablet (500 mg total) by mouth 2 (two) times daily. 07/13/24  Yes Enedelia Dorna HERO, FNP  acetaminophen  (TYLENOL ) 500 MG tablet Take 2,000 mg by mouth every 6 (six) hours as needed for moderate pain.    [provider]  albuterol  (VENTOLIN  HFA) 108 (90 Base) MCG/ACT inhaler Inhale 2 puffs into the lungs every 4 (four) hours as needed for shortness of breath or wheezing (or chest tightness). Patient not taking: Reported on 07/13/2024 04/09/23   Arloa Suzen RAMAN, NP  famotidine  (PEPCID ) 20 MG tablet Take 1 tablet (20 mg total) by mouth 2 (two) times daily. 10/17/19 09/04/20  Beather Delon Gibson, PA  sucralfate  (CARAFATE ) 1 g tablet Take 1 tablet (1 g total) by mouth 4 (four) times daily. 10/17/19 09/04/20  Beather Delon Gibson, PA    Family History Family History  Problem Relation Age of Onset   Healthy Mother    Seizures Father    Heart failure Father    Breast cancer Sister    Diabetes Sister    Diabetes Sister     Social History Social History   Tobacco Use   Smoking status: Every Day    Current packs/day: 0.25    Types: Cigarettes   Smokeless tobacco: Never  Vaping Use   Vaping status: Some Days  Substance Use Topics   Alcohol use: Yes    Alcohol/week: 8.0 standard drinks of alcohol    Types: 4 Glasses of wine, 4 Shots of liquor per week   Drug use: Yes    Types: Marijuana     Allergies   Patient has no known allergies.   Review of Systems Review of Systems  Musculoskeletal:  Positive for back pain.  Per HPI  Physical Exam Triage Vital Signs ED Triage Vitals  Encounter Vitals Group     BP 07/13/24 1045 (!) 126/90      Girls Systolic BP Percentile --      Girls Diastolic BP Percentile --      Boys Systolic BP Percentile --      Boys Diastolic BP Percentile --      Pulse Rate 07/13/24 1045 83     Resp 07/13/24 1045 20     Temp 07/13/24 1045 98.2 F (36.8 C)     Temp Source 07/13/24 1045 Oral     SpO2 07/13/24 1045 97 %     Weight --      Height --      Head Circumference --      Peak Flow --      Pain Score 07/13/24 1046 9     Pain Loc --      Pain Education --      Exclude from Growth Chart --    No data found.  Updated Vital Signs BP (!) 126/90 (BP Location: Right Arm)   Pulse 83   Temp 98.2 F (36.8 C) (Oral)   Resp 20   LMP 06/26/2024 (Exact Date)   SpO2 97%   Visual Acuity Right Eye Distance:   Left Eye Distance:   Bilateral Distance:    Right Eye Near:   Left Eye Near:    Bilateral Near:     Physical Exam Vitals and nursing note reviewed.  Constitutional:      Appearance: She is not ill-appearing or toxic-appearing.  HENT:     Head: Normocephalic and atraumatic.     Right Ear: Hearing and external ear normal.     Left Ear: Hearing and external ear normal.     Nose: Nose normal.     Mouth/Throat:     Lips: Pink.  Eyes:     General: Lids are normal. Vision grossly intact. Gaze aligned appropriately.     Extraocular Movements: Extraocular movements intact.     Conjunctiva/sclera: Conjunctivae normal.  Pulmonary:     Effort: Pulmonary effort is normal.  Abdominal:     Tenderness: There is no right CVA tenderness or left CVA tenderness.  Musculoskeletal:     Cervical back: Normal and neck supple.     Thoracic back: Normal.     Lumbar back: Spasms and tenderness (TTP over the right lumbar paraspinous musculature and the right SI joint region) present. No swelling, edema, deformity, signs of trauma, lacerations or bony tenderness. Decreased range of motion (secondary to pain). Positive right straight leg raise test.  Negative left straight leg raise test. No scoliosis.      Comments: 5/5 strength and sensation intact to bilateral lower extremities. +2 bilateral dorsalis pedis pulses. Ambulatory with steady gait.   Skin:    General: Skin is warm and dry.     Capillary Refill: Capillary refill takes less than 2 seconds.     Findings: No rash.  Neurological:     General: No focal deficit present.     Mental Status: She is alert and oriented to person, place, and time. Mental status is at baseline.     Cranial Nerves: No dysarthria or facial asymmetry.  Psychiatric:        Mood and Affect: Mood normal.        Speech: Speech normal.        Behavior: Behavior normal.        Thought Content: Thought content normal.        Judgment: Judgment normal.      UC Treatments / Results  Labs (all labs ordered are listed, but only abnormal results are displayed) Labs Reviewed  POCT URINALYSIS DIP (MANUAL ENTRY) - Abnormal; Notable for the following components:      Result Value   Spec Grav, UA >=1.030 (*)    Blood, UA small (*)    All other components within normal limits  POCT URINE PREGNANCY    EKG   Radiology No results found.  Procedures Procedures (including critical care time)  Medications Ordered in UC Medications  ketorolac  (TORADOL ) 30 MG/ML injection 30 mg (30 mg Intramuscular Given 07/13/24 1139)    Initial Impression / Assessment and Plan / UC Course  I have reviewed the triage vital signs and the nursing notes.  Pertinent labs & imaging results that were available during my care of the patient were reviewed by me and considered in my medical decision making (see chart for details).   1. Acute right-sided low back pain with right-sided sciatica, urinary frequency, urine pregnancy test negative Evaluation suggests sciatic nerve pain/muscle spasm.  Low suspicion for nephrolithiasis, however this is on the differential. Urinalysis shows small blood with elevated specific gravity, no signs of UTI.  Urine pregnancy is negative.   No red flag  signs/symptoms found on exam indicating need for referral to ED for further workup.  Deferred imaging based on atraumatic mechanism of injury.   Given ketorolac  30mg  IM in clinic for acute pain. We will trial use of naproxen every 12 hours as needed for pain to the low back as she has not yet attempted NSAID.  Muscle relaxer as needed for muscular involvement, drowsiness precautions discussed. Follow-up with orthopedics as needed, walking referral given.   Counseled patient on potential for adverse effects with medications prescribed/recommended today, strict ER and return-to-clinic precautions discussed, patient verbalized understanding.    Final Clinical Impressions(s) / UC Diagnoses   Final diagnoses:  Urinary frequency  Acute right-sided low back pain with right-sided sciatica  Urine pregnancy test negative     Discharge Instructions      Your back pain is likely due to a muscle strain which will improve on its own with time.   We gave you ketorolac  injection in the clinic today, so do not take any NSAIDs (ibuprofen , naproxen, aleve, naproxen, goody powders) for 24 hours. This medicine will help with inflammation associated with the pulled muscle.   Start taking naproxen tomorrow morning.   Continue tylenol  1,000mg  every 6 hours as needed for pain.   You may take  tylenol  as needed for aches and pains.  Take muscle relaxer as needed for muscle spasm, mostly take this at bedtime as this medicine can cause drowsiness.  Apply heat to the pulled muscle 20 minutes on 20 minutes off as needed, heat relaxes muscles.  Perform gentle exercises and stretches to area of tenderness.  I would like for you to rest, however I do not want you to avoid moving the area. Movement and stretching will help with healing.  Red flag symptoms to watch out for are numbness/tingling to the legs, weakness, loss of bowel/bladder control, and/or worsening pain that does not respond well to  medicines.  Follow-up with your primary care provider or return to urgent care if your symptoms do not improve in the next 3 to 4 days with medications and interventions recommended today. If your symptoms are severe (red flag), please go to the emergency room.        ED Prescriptions     Medication Sig Dispense Auth. Provider   naproxen (NAPROSYN) 500 MG tablet Take 1 tablet (500 mg total) by mouth 2 (two) times daily. 30 tablet Delvon Chipps M, FNP   baclofen (LIORESAL) 10 MG tablet Take 1 tablet (10 mg total) by mouth 3 (three) times daily. 30 each Enedelia Dorna HERO, FNP      PDMP not reviewed this encounter.   Enedelia Dorna HERO, FNP 07/13/24 1302    Enedelia Dorna HERO, FNP 07/13/24 [id number]

## 2024-07-13 NOTE — Medical Student Note (Signed)
 [facility] Insurance account manager Note For educational purposes for Medical, PA and NP students only and not part of the legal medical record.   CSN: [id number] Arrival date & time: 07/13/24  1000      History   Chief Complaint Chief Complaint  Patient presents with   Back Pain    HPI [name] is a 38 y.o. female.  Pt with a non-contributory hx presents with a 3 day hx of R lumbar pain with radiation down the R leg to the foot. She reports it woke her from her sleep when it started and has been constant with episodic spasms worsening the pain. She has not had a BM in 2 days. She has had the urge to defecate and void at times, but has not been able to at the time. Pt is voiding without any dysuria, hematuria, or frequency. Denies fever, chills, urinary/fecal incontinence, or saddle paresthesia. 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   The history is provided by the patient.  Back Pain Associated symptoms: no abdominal pain, no dysuria, no fever and no numbness     Past Medical History:  Diagnosis Date   HPV (human papilloma virus) infection     There are no active problems to display for this patient.   Past Surgical History:  Procedure Laterality Date   DIAGNOSTIC LAPAROSCOPY WITH REMOVAL OF ECTOPIC PREGNANCY N/A 03/27/2016   Procedure: Diagnostic  Laparoscopy,  Left Salpingectomy with Removal Ectopic Pregnancy;  Surgeon: Gloris DELENA Hugger, MD;  Location: [facility];  Service: Gynecology;  Laterality: N/A;   WISDOM TOOTH EXTRACTION      OB History     Gravida  1   Para  0   Term  0   Preterm  0   AB  1   Living  0      SAB  0   IAB  0   Ectopic  1   Multiple  0   Live Births               Home Medications    Prior to Admission medications   Medication Sig Start Date End Date Taking? Authorizing Provider  acetaminophen  (TYLENOL ) 500 MG tablet Take 2,000 mg by mouth every 6 (six) hours as  needed for moderate pain.    [provider]  albuterol  (VENTOLIN  HFA) 108 (90 Base) MCG/ACT inhaler Inhale 2 puffs into the lungs every 4 (four) hours as needed for shortness of breath or wheezing (or chest tightness). Patient not taking: Reported on 07/13/2024 04/09/23   Arloa Suzen RAMAN, NP  famotidine  (PEPCID ) 20 MG tablet Take 1 tablet (20 mg total) by mouth 2 (two) times daily. 10/17/19 09/04/20  Beather Delon Gibson, PA  sucralfate  (CARAFATE ) 1 g tablet Take 1 tablet (1 g total) by mouth 4 (four) times daily. 10/17/19 09/04/20  Beather Delon Gibson, PA    Family History Family History  Problem Relation Age of Onset   Healthy Mother    Seizures Father    Heart failure Father    Breast cancer Sister    Diabetes Sister    Diabetes Sister     Social History Social History   Tobacco Use   Smoking status: Every Day    Current packs/day: 0.25    Types: Cigarettes   Smokeless tobacco: Never  Vaping Use   Vaping status: Some Days  Substance Use Topics   Alcohol use: Yes  Alcohol/week: 8.0 standard drinks of alcohol    Types: 4 Glasses of wine, 4 Shots of liquor per week   Drug use: Yes    Types: Marijuana     Allergies   Patient has no known allergies.   Review of Systems Review of Systems  Constitutional:  Negative for chills and fever.  Gastrointestinal:  Positive for constipation. Negative for abdominal pain.  Genitourinary:  Negative for dysuria, frequency and hematuria.  Musculoskeletal:  Positive for back pain.  Neurological:  Negative for numbness.     Physical Exam Updated Vital Signs BP (!) 126/90 (BP Location: Right Arm)   Pulse 83   Temp 98.2 F (36.8 C) (Oral)   Resp 20   LMP 06/26/2024 (Exact Date)   SpO2 97%   Physical Exam Constitutional:      Appearance: Normal appearance.  Cardiovascular:     Rate and Rhythm: Normal rate and regular rhythm.     Pulses: Normal pulses.          Dorsalis pedis pulses are 2+ on the right side  and 2+ on the left side.     Heart sounds: Normal heart sounds.  Pulmonary:     Effort: Pulmonary effort is normal.     Breath sounds: Normal breath sounds.  Abdominal:     Palpations: Abdomen is soft.     Tenderness: There is no abdominal tenderness.  Musculoskeletal:     Lumbar back: Tenderness present. No bony tenderness. Positive right straight leg raise test. Negative left straight leg raise test.     Comments: R lumbar paraspinal tenderness that is worse over the SI joint.    Neurological:     Mental Status: She is alert.     Comments: Bilateral lower extremity strength 5/5 and sensation intact bilat      ED Treatments / Results  Labs (all labs ordered are listed, but only abnormal results are displayed) Labs Reviewed - No data to display  EKG  Radiology No results found.  Procedures Procedures (including critical care time)  Medications Ordered in ED Medications - No data to display   Initial Impression / Assessment and Plan / ED Course  I have reviewed the triage vital signs and the nursing notes.  Pertinent labs & imaging results that were available during my care of the patient were reviewed by me and considered in my medical decision making (see chart for details).     POC urine dip showed a SG of > 1.030 and small RBC. UPT negative.   Lumbar pain is likely lumbago with sciatica given exam findings and the reproducibility of the pain however, can not exclude nephrolithiasis. Discussed this with patient. Will treat MSK symptoms at this time with 30 mg Ketrolac IM in the clinic. Start Naproxen 500 mg BID PRN for pain and baclofen 10 mg three times a day PRN for muscle spasms. Sedation precautions given.   Pt is agreeable to plan with the understanding that if symptoms worsen or she has additional concerns for nephrolithiasis she will follow up in the ED. Red flag symptoms reviewed and return precautions given.    Final Clinical Impressions(s) / ED Diagnoses    Final diagnoses:  None    New Prescriptions New Prescriptions   No medications on file

## 2024-07-13 NOTE — ED Triage Notes (Addendum)
 Pt c/o pain in lower back x's 3 days  St's she woke up with the pain and denies any injury.  St's she does do a lot of heavy lifting at work.  Pt st's it feels like pressure in lower back and lower abd.  Pain increases with movement
